# Patient Record
Sex: Female | Born: 1995 | Race: Black or African American | Hispanic: No | Marital: Single | Smoking: Never smoker
Health system: Southern US, Community
[De-identification: ages and names within clinical notes are randomized; demographics above are authoritative.]

## PROBLEM LIST (undated history)

## (undated) DIAGNOSIS — F419 Anxiety disorder, unspecified: Secondary | ICD-10-CM

## (undated) DIAGNOSIS — D649 Anemia, unspecified: Secondary | ICD-10-CM

## (undated) DIAGNOSIS — M303 Mucocutaneous lymph node syndrome [Kawasaki]: Secondary | ICD-10-CM

## (undated) DIAGNOSIS — R109 Unspecified abdominal pain: Secondary | ICD-10-CM

## (undated) DIAGNOSIS — U071 COVID-19: Secondary | ICD-10-CM

## (undated) DIAGNOSIS — N809 Endometriosis, unspecified: Secondary | ICD-10-CM

## (undated) DIAGNOSIS — F909 Attention-deficit hyperactivity disorder, unspecified type: Secondary | ICD-10-CM

## (undated) HISTORY — PX: EYE SURGERY: SHX253

## (undated) HISTORY — PX: MOUTH SURGERY: SHX715

## (undated) HISTORY — PX: WISDOM TOOTH EXTRACTION: SHX21

---

## 2018-08-12 ENCOUNTER — Encounter (HOSPITAL_COMMUNITY): Payer: Self-pay | Admitting: Obstetrics and Gynecology

## 2018-08-12 ENCOUNTER — Emergency Department (HOSPITAL_COMMUNITY)
Admission: EM | Admit: 2018-08-12 | Discharge: 2018-08-13 | Disposition: A | Discharge status: Home / Self Care | Payer: BC Managed Care – PPO | Attending: Emergency Medicine | Admitting: Emergency Medicine

## 2018-08-12 ENCOUNTER — Other Ambulatory Visit: Payer: Self-pay

## 2018-08-12 DIAGNOSIS — N3000 Acute cystitis without hematuria: Secondary | ICD-10-CM

## 2018-08-12 DIAGNOSIS — R1030 Lower abdominal pain, unspecified: Secondary | ICD-10-CM | POA: Diagnosis present

## 2018-08-12 LAB — COMPREHENSIVE METABOLIC PANEL
ALT: 27 U/L (ref 0–44)
AST: 34 U/L (ref 15–41)
Albumin: 3.6 g/dL (ref 3.5–5.0)
Alkaline Phosphatase: 74 U/L (ref 38–126)
Anion gap: 11 (ref 5–15)
BUN: 13 mg/dL (ref 6–20)
CO2: 23 mmol/L (ref 22–32)
Calcium: 8.6 mg/dL — ABNORMAL LOW (ref 8.9–10.3)
Chloride: 100 mmol/L (ref 98–111)
Creatinine, Ser: 0.66 mg/dL (ref 0.44–1.00)
GFR calc Af Amer: >60 mL/min (ref >60)
GFR calc non Af Amer: >60 mL/min (ref >60)
Glucose, Bld: 122 mg/dL — ABNORMAL HIGH (ref 70–99)
Potassium: 3.8 mmol/L (ref 3.5–5.1)
Sodium: 134 mmol/L — ABNORMAL LOW (ref 135–145)
Total Bilirubin: 0.5 mg/dL (ref 0.3–1.2)
Total Protein: 7 g/dL (ref 6.5–8.1)

## 2018-08-12 LAB — LIPASE, BLOOD: Lipase: 20 U/L (ref 11–51)

## 2018-08-12 LAB — CBC
HCT: 33.5 % — ABNORMAL LOW (ref 36.0–46.0)
Hemoglobin: 10.2 g/dL — ABNORMAL LOW (ref 12.0–15.0)
MCH: 23.5 pg — ABNORMAL LOW (ref 26.0–34.0)
MCHC: 30.4 g/dL (ref 30.0–36.0)
MCV: 77.2 fL — ABNORMAL LOW (ref 80.0–100.0)
Platelets: 256 10*3/uL (ref 150–400)
RBC: 4.34 MIL/uL (ref 3.87–5.11)
RDW: 14.1 % (ref 11.5–15.5)
WBC: 14.2 10*3/uL — ABNORMAL HIGH (ref 4.0–10.5)
nRBC: 0 % (ref 0.0–0.2)

## 2018-08-12 LAB — I-STAT BETA HCG BLOOD, ED (MC, WL, AP ONLY): I-stat hCG, quantitative: <5 m[IU]/mL (ref <5)

## 2018-08-12 MED ORDER — SODIUM CHLORIDE 0.9% FLUSH
3.0000 mL | Freq: Once | INTRAVENOUS | Status: AC
  Administered 2018-08-12: 3 mL via INTRAVENOUS

## 2018-08-12 MED ORDER — ACETAMINOPHEN 325 MG PO TABS
650.0000 mg | ORAL_TABLET | Freq: Once | ORAL | Status: AC
  Administered 2018-08-12: 650 mg via ORAL
  Filled 2018-08-12: qty 2

## 2018-08-12 MED ORDER — SODIUM CHLORIDE 0.9 % IV BOLUS
1000.0000 mL | Freq: Once | INTRAVENOUS | Status: AC
  Administered 2018-08-12: 1000 mL via INTRAVENOUS

## 2018-08-12 NOTE — ED Triage Notes (Signed)
Pt reports abdominal pain. Pt went to the urgent care this morning and was told she possibly had something wrong with her appendix. Pt reports pain with walking. Pt reports 1 episode of emesis x2 days ago. Pt reports she last ate at approximately 7pm

## 2018-08-12 NOTE — ED Provider Notes (Signed)
[facility]-EMERGENCY DEPT Provider Note   CSN: [id number] Arrival date & time: 08/12/18  2223    History   Chief Complaint Chief Complaint  Patient presents with  . Abdominal Pain    HPI Brooke Poole is a 23 y.o. female.      Abdominal Pain  Associated symptoms: nausea and vomiting      22 y.o. F here with lower abdominal pain.  Patient reports she started having lower abdominal pain on Sunday, 4 days ago.  Patient reports Tuesday, 2 days ago, pain got a little worse and caused her to vomit but none since then.  She states since that time she has not really had much of an appetite.  She does report urinary frequency and some "pressure" with urination but denies any dysuria or hematuria.  She is not had any pelvic pain or vaginal discharge.  She was seen at urgent care today and requested urinalysis, however was sent to the ER instead.  She denies history of UTIs.  No prior abdominal surgeries.  She has fever here, states she has been chilled at home but did not check temp as she did not have a thermometer.  No past medical history on file.  There are no active problems to display for this patient.    OB History    Gravida      Para      Term      Preterm      AB      Living  0     SAB      TAB      Ectopic      Multiple      Live Births               Home Medications    Prior to Admission medications   Not on File    Family History No family history on file.  Social History Social History   Tobacco Use  . Smoking status: Never Smoker  . Smokeless tobacco: Never Used  Substance Use Topics  . Alcohol use: Yes    Comment: Socially  . Drug use: Not Currently     Allergies   Patient has no allergy information on record.   Review of Systems Review of Systems  Gastrointestinal: Positive for abdominal pain, nausea and vomiting.  All other systems reviewed and are negative.    Physical Exam Updated Vital  Signs BP 123/77 (BP Location: Left Arm)   Pulse (!) 114   Temp (!) 101 F (38.3 C) (Oral)   Resp 20   Ht  (1.676 m)   Wt 49.9 kg   LMP 07/28/2018 (Approximate)   SpO2 100%   BMI 17.75 kg/m   Physical Exam Vitals signs and nursing note reviewed.  Constitutional:      Appearance: She is well-developed.  HENT:     Head: Normocephalic and atraumatic.  Eyes:     Conjunctiva/sclera: Conjunctivae normal.     Pupils: Pupils are equal, round, and reactive to light.  Neck:     Musculoskeletal: Normal range of motion.  Cardiovascular:     Rate and Rhythm: Normal rate and regular rhythm.     Heart sounds: Normal heart sounds.  Pulmonary:     Effort: Pulmonary effort is normal.     Breath sounds: Normal breath sounds.  Abdominal:     General: Bowel sounds are normal.     Palpations: Abdomen is soft.  Tenderness: Negative signs include Murphy's sign and McBurney's sign.     Comments: Very mild tenderness in right < left lower quadrants; no peritoneal signs; no CVA tenderness  Musculoskeletal: Normal range of motion.  Skin:    General: Skin is warm and dry.  Neurological:     Mental Status: She is alert and oriented to person, place, and time.      ED Treatments / Results  Labs (all labs ordered are listed, but only abnormal results are displayed) Labs Reviewed  COMPREHENSIVE METABOLIC PANEL - Abnormal; Notable for the following components:      Result Value   Sodium 134 (*)    Glucose, Bld 122 (*)    Calcium 8.6 (*)    All other components within normal limits  CBC - Abnormal; Notable for the following components:   WBC 14.2 (*)    Hemoglobin 10.2 (*)    HCT 33.5 (*)    MCV 77.2 (*)    MCH 23.5 (*)    All other components within normal limits  URINALYSIS, ROUTINE W REFLEX MICROSCOPIC - Abnormal; Notable for the following components:   Color, Urine STRAW (*)    Hgb urine dipstick SMALL (*)    Leukocytes,Ua SMALL (*)    Bacteria, UA MANY (*)    All other  components within normal limits  LIPASE, BLOOD  I-STAT BETA HCG BLOOD, ED ([facility], [facility], [facility] ONLY)    EKG None  Radiology No results found.  Procedures Procedures (including critical care time)  Medications Ordered in ED Medications  sodium chloride flush (NS) 0.9 % injection 3 mL (3 mLs Intravenous Given 08/12/18 2312)  sodium chloride 0.9 % bolus 1,000 mL (0 mLs Intravenous Stopped 08/13/18 0150)  acetaminophen (TYLENOL) tablet 650 mg (650 mg Oral Given 08/12/18 2311)  cefTRIAXone (ROCEPHIN) 1 g in sodium chloride 0.9 % 100 mL IVPB (0 g Intravenous Stopped 08/13/18 0150)     Initial Impression / Assessment and Plan / ED Course  I have reviewed the triage vital signs and the nursing notes.  Pertinent labs & imaging results that were available during my care of the patient were reviewed by me and considered in my medical decision making (see chart for details).  23 year old female presenting to the ED with lower abdominal pain.  Has been ongoing for several days now.  Episode of emesis 2 days ago, reports subjective fever, chills, poor appetite since then.  She does report urinary frequency but denies any dysuria.  She is febrile here but overall nontoxic in appearance.  Her abdomen is very soft with mild tenderness across the lower abdomen, left is actually worse than right.  She does not have any focal tenderness at McBurney's point and no peritoneal signs.  Labs as above, leukocytosis noted.  Her UA appears infectious.  She is not having any current CVA tenderness.  Feel UTI is because of her symptoms, lower suspicion for acute appendicitis or other surgical process at this time.  Patient was given a gram of IV Rocephin here in the ED along with IV fluids, states she is feeling better.  Plan to discharge home with oral Keflex.  Encouraged good oral hydration, tylenol/motrin for fever.  Recommended close follow-up with PCP.  Return here for any new/acute changes.  Final Clinical Impressions(s) /  ED Diagnoses   Final diagnoses:  Acute cystitis without hematuria    ED Discharge Orders         Ordered    cephALEXin (KEFLEX) 500 MG  capsule  2 times daily     08/13/18 0117           Garlon Hatchet, PA-C 08/13/18 8115    Raeford Razor, MD 08/16/18 [phone]

## 2018-08-13 LAB — URINALYSIS, ROUTINE W REFLEX MICROSCOPIC
Bilirubin Urine: NEGATIVE
Glucose, UA: NEGATIVE mg/dL
Ketones, ur: NEGATIVE mg/dL
Nitrite: NEGATIVE
Protein, ur: NEGATIVE mg/dL
Specific Gravity, Urine: 1.008 (ref 1.005–1.030)
pH: 7 (ref 5.0–8.0)

## 2018-08-13 MED ORDER — CEPHALEXIN 500 MG PO CAPS: 500.0000 mg | ORAL_CAPSULE | Freq: Two times a day (BID) | ORAL | 0 refills | Status: DC

## 2018-08-13 MED ORDER — SODIUM CHLORIDE 0.9 % IV SOLN
1.0000 g | Freq: Once | INTRAVENOUS | Status: AC
  Administered 2018-08-13: 1 g via INTRAVENOUS
  Filled 2018-08-13: qty 10

## 2018-08-13 NOTE — Discharge Instructions (Signed)
Take the prescribed medication as directed.  Can continue tylenol or motrin for fever. Follow-up with your primary care doctor. Return to the ED for new or worsening symptoms.

## 2019-10-03 ENCOUNTER — Encounter (HOSPITAL_BASED_OUTPATIENT_CLINIC_OR_DEPARTMENT_OTHER): Payer: Self-pay | Admitting: Obstetrics and Gynecology

## 2019-10-03 NOTE — H&P (Signed)
Brooke Poole is an 24 y.o. female 17 who failed multiple medications and desires surgical exploration and possible destruction of endometriosis. She has failed 7 different OCPs, nexplanon, an IUD, and the patch. She continues to have painful, heavy periods with nausea that require prescription strength pain medications and anti-emetics.  Pertinent Gynecological History: Menses: flow is excessive with use of 10 pads or tampons on heaviest days and with severe dysmenorrhea Bleeding: dysfunctional uterine bleeding Contraception: Ortho-Evra patches weekly DES exposure: denies Blood transfusions: none Sexually transmitted diseases: no past history Previous GYN Procedures: n/a  Last mammogram: n/a Last pap: n/a OB History: G0   Menstrual History: No LMP recorded.    Past Medical History:  Diagnosis Date  . ADHD   . Anxiety   . Endometriosis    suspected    Past Surgical History:  Procedure Laterality Date  . EYE SURGERY    . WISDOM TOOTH EXTRACTION      No family history on file.  Social History:  reports that she has never smoked. She has never used smokeless tobacco. She reports current alcohol use. She reports previous drug use.  Allergies: Not on File  No medications prior to admission.  Xulane vyvannse Flexeril prn Naproxen prn Propranolol prn  Review of Systems  There were no vitals taken for this visit. Physical Exam  Gen: well appearing, NAD CV: Reg rate Pulm: NWOB Abd: soft, nondistended, nontender, no masses GYN: uterus 6 week size, no adnexa ttp/CMT Ext: No edema b/l   No results found for this or any previous visit (from the past 24 hour(s)).  No results found.  Assessment/Plan: 24 yo w/pelvic pain refractoryto many medications currently on xulane patch.  After careful discussing, weighing the risks and benefits, she elected to have a laparoscopy for confirmation of endometriosis and possible fulguration and/or resection of endometriosis. I  reiterated that this type surgery is usually not curative and may not help her pain. I reviewed the possibility of finding no disease or that even if disease is found, it may be in areas that I am unable to treat (on bladder, too close to ureter). Furthermore, she may experience scarring from the surgery or need multiple surgeries with multiple subspecalties. Risks discussed including infection, bleeding, damage to surrounding structures, need for additional procedures, postoperative DVT and above. All questions answered. Consent signed.   Brooke Poole 10/03/2019, 9:45 AM

## 2019-10-06 ENCOUNTER — Ambulatory Visit: Payer: BC Managed Care – PPO | Attending: Family

## 2019-10-06 DIAGNOSIS — Z23 Encounter for immunization: Secondary | ICD-10-CM

## 2019-10-06 NOTE — Progress Notes (Signed)
   Covid-19 Vaccination Clinic  Name:  Brooke Poole    MRN: [id number] DOB: 06-19-1995  10/06/2019  Brooke Poole was observed post Covid-19 immunization for 15 minutes without incident. She was provided with Vaccine Information Sheet and instruction to access the V-Safe system.   Brooke Poole was instructed to call 911 with any severe reactions post vaccine: Marland Kitchen Difficulty breathing  . Swelling of face and throat  . A fast heartbeat  . A bad rash all over body  . Dizziness and weakness   Immunizations Administered    Name Date Dose VIS Date Route   Pfizer COVID-19 Vaccine 10/06/2019  1:05 PM 0.3 mL 07/13/2018 Intramuscular   Manufacturer: [employer], [employer]   Lot: [id number]   NDC: [address]-7

## 2019-10-18 ENCOUNTER — Other Ambulatory Visit: Payer: Self-pay

## 2019-10-18 ENCOUNTER — Encounter (HOSPITAL_BASED_OUTPATIENT_CLINIC_OR_DEPARTMENT_OTHER): Payer: Self-pay | Admitting: Obstetrics and Gynecology

## 2019-10-18 ENCOUNTER — Other Ambulatory Visit (HOSPITAL_COMMUNITY)
Admission: RE | Admit: 2019-10-18 | Discharge: 2019-10-18 | Disposition: A | Discharge status: Home / Self Care | Payer: BC Managed Care – PPO | Source: Ambulatory Visit | Attending: Obstetrics and Gynecology | Admitting: Obstetrics and Gynecology

## 2019-10-18 DIAGNOSIS — Z20822 Contact with and (suspected) exposure to covid-19: Secondary | ICD-10-CM | POA: Insufficient documentation

## 2019-10-18 DIAGNOSIS — Z01812 Encounter for preprocedural laboratory examination: Secondary | ICD-10-CM | POA: Insufficient documentation

## 2019-10-18 NOTE — Progress Notes (Addendum)
Addendum: spoke with jessica zanetto pa patient meets surgery center guidelines  Spoke w/ via phone for pre-op interview---mother alecia and patient Lab needs dos----      Urine preg, type and screen         COVID test ------10-18-2019 at 240 pm Arrive at ------530 am 10-21-2019 NPO after ------midnight Medications to take morning of surgery -----may leave xulane birth control patch on Diabetic medication -----n/a Patient Special Instructions -----none Pre-Op special Istructions -----none Patient verbalized understanding of instructions that were given at this phone interview. Patient denies shortness of breath, chest pain, fever, cough a this phone interview.  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  Patient has xulane birth control patch on ask anesthesia if needs to remove patch

## 2019-10-19 LAB — SARS CORONAVIRUS 2 (TAT 6-24 HRS): SARS Coronavirus 2: NEGATIVE

## 2019-10-20 NOTE — Anesthesia Preprocedure Evaluation (Addendum)
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   Neuro/Psych negative neurological ROS  negative psych ROS   GI/Hepatic negative GI ROS, Neg liver ROS,   Endo/Other  negative endocrine ROS  Renal/GU negative Renal ROS   Chronic pelvic pain, endometriosis    Musculoskeletal negative musculoskeletal ROS (+)   Abdominal   Peds  Hematology negative hematology ROS (+)   Anesthesia Other Findings   Reproductive/Obstetrics                         Anesthesia Physical Anesthesia Plan  ASA: II  Anesthesia Plan: General   Post-op Pain Management:    Induction: Intravenous  PONV Risk Score and Plan: 3 and Ondansetron, Dexamethasone, Treatment may vary due to age or medical condition and Midazolam  Airway Management Planned: Oral ETT  Additional Equipment: None  Intra-op Plan:   Post-operative Plan: Extubation in OR  Informed Consent: I have reviewed the patients History and Physical, chart, labs and discussed the procedure including the risks, benefits and alternatives for the proposed anesthesia with the patient or authorized representative who has indicated his/her understanding and acceptance.     Dental advisory given  Plan Discussed with:   Anesthesia Plan Comments:        Anesthesia Quick Evaluation

## 2019-10-21 ENCOUNTER — Ambulatory Visit (HOSPITAL_BASED_OUTPATIENT_CLINIC_OR_DEPARTMENT_OTHER): Payer: BC Managed Care – PPO | Admitting: Physician Assistant

## 2019-10-21 ENCOUNTER — Encounter (HOSPITAL_BASED_OUTPATIENT_CLINIC_OR_DEPARTMENT_OTHER): Payer: Self-pay | Admitting: Obstetrics and Gynecology

## 2019-10-21 ENCOUNTER — Other Ambulatory Visit: Payer: Self-pay

## 2019-10-21 ENCOUNTER — Encounter (HOSPITAL_BASED_OUTPATIENT_CLINIC_OR_DEPARTMENT_OTHER)
Admission: RE | Disposition: A | Discharge status: Home / Self Care | Payer: Self-pay | Source: Home / Self Care | Attending: Obstetrics and Gynecology

## 2019-10-21 ENCOUNTER — Ambulatory Visit (HOSPITAL_BASED_OUTPATIENT_CLINIC_OR_DEPARTMENT_OTHER)
Admission: RE | Admit: 2019-10-21 | Discharge: 2019-10-21 | Disposition: A | Discharge status: Home / Self Care | Payer: BC Managed Care – PPO | Attending: Obstetrics and Gynecology | Admitting: Obstetrics and Gynecology

## 2019-10-21 DIAGNOSIS — Z793 Long term (current) use of hormonal contraceptives: Secondary | ICD-10-CM | POA: Diagnosis not present

## 2019-10-21 DIAGNOSIS — Z79899 Other long term (current) drug therapy: Secondary | ICD-10-CM | POA: Diagnosis not present

## 2019-10-21 DIAGNOSIS — N938 Other specified abnormal uterine and vaginal bleeding: Secondary | ICD-10-CM | POA: Insufficient documentation

## 2019-10-21 DIAGNOSIS — N946 Dysmenorrhea, unspecified: Secondary | ICD-10-CM | POA: Insufficient documentation

## 2019-10-21 DIAGNOSIS — F909 Attention-deficit hyperactivity disorder, unspecified type: Secondary | ICD-10-CM | POA: Diagnosis not present

## 2019-10-21 DIAGNOSIS — G8929 Other chronic pain: Secondary | ICD-10-CM | POA: Insufficient documentation

## 2019-10-21 DIAGNOSIS — R102 Pelvic and perineal pain: Secondary | ICD-10-CM | POA: Insufficient documentation

## 2019-10-21 HISTORY — DX: Attention-deficit hyperactivity disorder, unspecified type: F90.9

## 2019-10-21 HISTORY — DX: Endometriosis, unspecified: N80.9

## 2019-10-21 HISTORY — DX: Anemia, unspecified: D64.9

## 2019-10-21 HISTORY — DX: Mucocutaneous lymph node syndrome (kawasaki): M30.3

## 2019-10-21 HISTORY — DX: Anxiety disorder, unspecified: F41.9

## 2019-10-21 HISTORY — PX: LAPAROSCOPY: SHX197

## 2019-10-21 HISTORY — DX: COVID-19: U07.1

## 2019-10-21 HISTORY — DX: Unspecified abdominal pain: R10.9

## 2019-10-21 LAB — POCT PREGNANCY, URINE: Preg Test, Ur: NEGATIVE

## 2019-10-21 LAB — TYPE AND SCREEN
ABO/RH(D): O POS
Antibody Screen: NEGATIVE

## 2019-10-21 LAB — ABO/RH: ABO/RH(D): O POS

## 2019-10-21 SURGERY — LAPAROSCOPY, DIAGNOSTIC
Anesthesia: General | Site: Abdomen

## 2019-10-21 MED ORDER — OXYCODONE HCL 5 MG PO TABS
ORAL_TABLET | ORAL | Status: AC
  Filled 2019-10-21: qty 1

## 2019-10-21 MED ORDER — ACETAMINOPHEN 500 MG PO TABS
1000.0000 mg | ORAL_TABLET | ORAL | Status: AC
  Administered 2019-10-21: 1000 mg via ORAL

## 2019-10-21 MED ORDER — DEXAMETHASONE SODIUM PHOSPHATE 10 MG/ML IJ SOLN
INTRAMUSCULAR | Status: DC | PRN
  Administered 2019-10-21: 10 mg via INTRAVENOUS

## 2019-10-21 MED ORDER — OXYCODONE HCL 5 MG PO TABS: 5.0000 mg | ORAL_TABLET | Freq: Four times a day (QID) | ORAL | 0 refills | Status: DC | PRN

## 2019-10-21 MED ORDER — FENTANYL CITRATE (PF) 100 MCG/2ML IJ SOLN
25.0000 ug | INTRAMUSCULAR | Status: DC | PRN
  Administered 2019-10-21: 50 ug via INTRAVENOUS

## 2019-10-21 MED ORDER — PROPOFOL 10 MG/ML IV BOLUS
INTRAVENOUS | Status: DC | PRN
  Administered 2019-10-21: 200 mg via INTRAVENOUS

## 2019-10-21 MED ORDER — KETOROLAC TROMETHAMINE 30 MG/ML IJ SOLN
INTRAMUSCULAR | Status: AC
  Filled 2019-10-21: qty 1

## 2019-10-21 MED ORDER — ONDANSETRON HCL 4 MG/2ML IJ SOLN: 4.0000 mg | Freq: Once | INTRAMUSCULAR | Status: DC | PRN

## 2019-10-21 MED ORDER — FENTANYL CITRATE (PF) 100 MCG/2ML IJ SOLN
INTRAMUSCULAR | Status: DC | PRN
  Administered 2019-10-21 (×3): 50 ug via INTRAVENOUS

## 2019-10-21 MED ORDER — MIDAZOLAM HCL 5 MG/5ML IJ SOLN
INTRAMUSCULAR | Status: DC | PRN
  Administered 2019-10-21: 2 mg via INTRAVENOUS

## 2019-10-21 MED ORDER — FENTANYL CITRATE (PF) 100 MCG/2ML IJ SOLN
INTRAMUSCULAR | Status: AC
  Filled 2019-10-21: qty 2

## 2019-10-21 MED ORDER — ACETAMINOPHEN 500 MG PO TABS
ORAL_TABLET | ORAL | Status: AC
  Filled 2019-10-21: qty 2

## 2019-10-21 MED ORDER — PROPOFOL 10 MG/ML IV BOLUS
INTRAVENOUS | Status: AC
  Filled 2019-10-21: qty 20

## 2019-10-21 MED ORDER — LACTATED RINGERS IV SOLN
INTRAVENOUS | Status: DC
  Administered 2019-10-21: 1000 mL via INTRAVENOUS

## 2019-10-21 MED ORDER — MIDAZOLAM HCL 2 MG/2ML IJ SOLN
INTRAMUSCULAR | Status: AC
  Filled 2019-10-21: qty 2

## 2019-10-21 MED ORDER — DEXAMETHASONE SODIUM PHOSPHATE 10 MG/ML IJ SOLN
INTRAMUSCULAR | Status: AC
  Filled 2019-10-21: qty 1

## 2019-10-21 MED ORDER — LIDOCAINE 2% (20 MG/ML) 5 ML SYRINGE
INTRAMUSCULAR | Status: DC | PRN
  Administered 2019-10-21: 80 mg via INTRAVENOUS

## 2019-10-21 MED ORDER — DOCUSATE SODIUM 100 MG PO CAPS
100.0000 mg | ORAL_CAPSULE | Freq: Two times a day (BID) | ORAL | 2 refills | Status: DC
Start: 2019-10-21 — End: 2021-05-21

## 2019-10-21 MED ORDER — OXYCODONE HCL 5 MG/5ML PO SOLN: 5.0000 mg | Freq: Once | ORAL | Status: AC | PRN

## 2019-10-21 MED ORDER — ROCURONIUM BROMIDE 10 MG/ML (PF) SYRINGE
PREFILLED_SYRINGE | INTRAVENOUS | Status: AC
  Filled 2019-10-21: qty 10

## 2019-10-21 MED ORDER — ONDANSETRON HCL 4 MG/2ML IJ SOLN
INTRAMUSCULAR | Status: DC | PRN
  Administered 2019-10-21: 4 mg via INTRAVENOUS

## 2019-10-21 MED ORDER — OXYCODONE HCL 5 MG PO TABS
5.0000 mg | ORAL_TABLET | Freq: Once | ORAL | Status: AC | PRN
  Administered 2019-10-21: 5 mg via ORAL

## 2019-10-21 MED ORDER — LIDOCAINE 2% (20 MG/ML) 5 ML SYRINGE
INTRAMUSCULAR | Status: AC
  Filled 2019-10-21: qty 5

## 2019-10-21 MED ORDER — ROCURONIUM BROMIDE 10 MG/ML (PF) SYRINGE
PREFILLED_SYRINGE | INTRAVENOUS | Status: DC | PRN
  Administered 2019-10-21: 40 mg via INTRAVENOUS

## 2019-10-21 MED ORDER — KETOROLAC TROMETHAMINE 30 MG/ML IJ SOLN
INTRAMUSCULAR | Status: DC | PRN
  Administered 2019-10-21: 30 mg via INTRAVENOUS

## 2019-10-21 MED ORDER — SUGAMMADEX SODIUM 200 MG/2ML IV SOLN
INTRAVENOUS | Status: DC | PRN
  Administered 2019-10-21: 150 mg via INTRAVENOUS

## 2019-10-21 MED ORDER — IBUPROFEN 800 MG PO TABS: 800.0000 mg | ORAL_TABLET | Freq: Three times a day (TID) | ORAL | 0 refills | Status: AC | PRN

## 2019-10-21 MED ORDER — ONDANSETRON HCL 4 MG/2ML IJ SOLN
INTRAMUSCULAR | Status: AC
  Filled 2019-10-21: qty 2

## 2019-10-21 SURGICAL SUPPLY — 34 items
CABLE HIGH FREQUENCY MONO STRZ (ELECTRODE) IMPLANT
CATH ROBINSON RED A/P 16FR (CATHETERS) ×3 IMPLANT
COVER MAYO STAND STRL (DRAPES) ×3 IMPLANT
COVER WAND RF STERILE (DRAPES) ×3 IMPLANT
DERMABOND ADVANCED (GAUZE/BANDAGES/DRESSINGS) ×1
DERMABOND ADVANCED .7 DNX12 (GAUZE/BANDAGES/DRESSINGS) ×2 IMPLANT
DRSG OPSITE POSTOP 3X4 (GAUZE/BANDAGES/DRESSINGS) IMPLANT
DURAPREP 26ML APPLICATOR (WOUND CARE) ×3 IMPLANT
GAUZE 4X4 16PLY RFD (DISPOSABLE) ×3 IMPLANT
GLOVE BIO SURGEON STRL SZ 6.5 (GLOVE) ×9 IMPLANT
GLOVE BIOGEL PI IND STRL 6.5 (GLOVE) ×6 IMPLANT
GLOVE BIOGEL PI INDICATOR 6.5 (GLOVE) ×3
GOWN STRL REUS W/TWL LRG LVL3 (GOWN DISPOSABLE) ×9 IMPLANT
IV NS 1000ML (IV SOLUTION) ×1
IV NS 1000ML BAXH (IV SOLUTION) ×2 IMPLANT
LIGASURE LAP L-HOOKWIRE 5 44CM (INSTRUMENTS) IMPLANT
NEEDLE HYPO 25X1 1.5 SAFETY (NEEDLE) ×3 IMPLANT
NEEDLE INSUFFLATION 120MM (ENDOMECHANICALS) ×3 IMPLANT
NS IRRIG 500ML POUR BTL (IV SOLUTION) ×3 IMPLANT
PACK LAPAROSCOPY BASIN (CUSTOM PROCEDURE TRAY) ×3 IMPLANT
SCISSORS LAP 5X35 DISP (ENDOMECHANICALS) IMPLANT
SCISSORS LAP 5X45 EPIX DISP (ENDOMECHANICALS) IMPLANT
SET SUCTION IRRIG HYDROSURG (IRRIGATION / IRRIGATOR) ×3 IMPLANT
SET TUBE SMOKE EVAC HIGH FLOW (TUBING) ×3 IMPLANT
SUT VICRYL 0 UR6 27IN ABS (SUTURE) ×3 IMPLANT
SUT VICRYL RAPIDE 4/0 PS 2 (SUTURE) ×6 IMPLANT
SYR 50ML LL SCALE MARK (SYRINGE) IMPLANT
SYS BAG RETRIEVAL 10MM (BASKET)
SYSTEM BAG RETRIEVAL 10MM (BASKET) IMPLANT
TOWEL OR 17X26 10 PK STRL BLUE (TOWEL DISPOSABLE) ×3 IMPLANT
TROCAR BLADELESS OPT 5 100 (ENDOMECHANICALS) ×6 IMPLANT
TROCAR XCEL NON-BLD 11X100MML (ENDOMECHANICALS) ×3 IMPLANT
WARMER LAPAROSCOPE (MISCELLANEOUS) ×3 IMPLANT
WATER STERILE IRR 500ML POUR (IV SOLUTION) ×3 IMPLANT

## 2019-10-21 NOTE — Transfer of Care (Signed)
Immediate Anesthesia Transfer of Care Note  Patient: Brooke Poole  Procedure(s) Performed: DIAGNOSTIC LAPAROSCOPY (N/A Abdomen)  Patient Location: PACU  Anesthesia Type:General  Level of Consciousness: awake, alert  and oriented  Airway & Oxygen Therapy: Patient Spontanous Breathing and Patient connected to nasal cannula oxygen  Post-op Assessment: Report given to RN and Post -op Vital signs reviewed and stable  Post vital signs: Reviewed and stable  Last Vitals:  Vitals Value Taken Time  BP 146/88 10/21/19 0830  Temp 36.3 C 10/21/19 0826  Pulse 53 10/21/19 0831  Resp 8 10/21/19 0831  SpO2 100 % 10/21/19 0831  Vitals shown include unvalidated device data.  Last Pain:  Vitals:   10/21/19 0625  TempSrc: Oral      Patients Stated Pain Goal: 7 (10/21/19 4825)  Complications: No apparent anesthesia complications

## 2019-10-21 NOTE — Discharge Instructions (Signed)
DISCHARGE INSTRUCTIONS: Laparoscopy  The following instructions have been prepared to help you care for yourself upon your return home today.  Wound care:  Do not get the incision wet for the first 24 hours. The incision should be kept clean and dry.  The Band-Aids or dressings may be removed the day after surgery.  Should the incision become sore, red, and swollen after the first week, check with your doctor.  Personal hygiene:  Shower the day after your procedure.  Activity and limitations:  Do NOT drive or operate any equipment today.  Do NOT lift anything more than 15 pounds for 2-3 weeks after surgery.  Do NOT rest in bed all day.  Walking is encouraged. Walk each day, starting slowly with 5-minute walks 3 or 4 times a day. Slowly increase the length of your walks.  Walk up and down stairs slowly.  Do NOT do strenuous activities, such as golfing, playing tennis, bowling, running, biking, weight lifting, gardening, mowing, or vacuuming for 2-4 weeks. Ask your doctor when it is okay to start.  Diet: Eat a light meal as desired this evening. You may resume your usual diet tomorrow.  Return to work: This is dependent on the type of work you do. For the most part you can return to a desk job within a week of surgery. If you are more active at work, please discuss this with your doctor.  What to expect after your surgery: You may have a slight burning sensation when you urinate on the first day. You may have a very small amount of blood in the urine. Expect to have a small amount of vaginal discharge/light bleeding for 1-2 weeks. It is not unusual to have abdominal soreness and bruising for up to 2 weeks. You may be tired and need more rest for about 1 week. You may experience shoulder pain for 24-72 hours. Lying flat in bed may relieve it.  Call your doctor for any of the following:  Develop a fever of 100.4 or greater  Inability to urinate 6 hours after discharge from hospital  Severe  pain not relieved by pain medications  Persistent of heavy bleeding at incision site  Redness or swelling around incision site after a week  Increasing nausea or vomiting     Post Anesthesia Home Care Instructions  Activity: Get plenty of rest for the remainder of the day. A responsible individual must stay with you for 24 hours following the procedure.  For the next 24 hours, DO NOT: -Drive a car -Operate machinery -Drink alcoholic beverages -Take any medication unless instructed by your physician -Make any legal decisions or sign important papers.  Meals: Start with liquid foods such as gelatin or soup. Progress to regular foods as tolerated. Avoid greasy, spicy, heavy foods. If nausea and/or vomiting occur, drink only clear liquids until the nausea and/or vomiting subsides. Call your physician if vomiting continues.  Special Instructions/Symptoms: Your throat may feel dry or sore from the anesthesia or the breathing tube placed in your throat during surgery. If this causes discomfort, gargle with warm salt water. The discomfort should disappear within 24 hours.       

## 2019-10-21 NOTE — Anesthesia Procedure Notes (Signed)
Procedure Name: Intubation Date/Time: 10/21/2019 7:32 AM Performed by: Cici Rodriges D, CRNA Pre-anesthesia Checklist: Patient identified, Emergency Drugs available, Suction available and Patient being monitored Patient Re-evaluated:Patient Re-evaluated prior to induction Oxygen Delivery Method: Circle system utilized Preoxygenation: Pre-oxygenation with 100% oxygen Induction Type: IV induction Ventilation: Mask ventilation without difficulty Laryngoscope Size: Mac and 3 Grade View: Grade I Tube type: Oral Tube size: 7.0 mm Number of attempts: 1 Airway Equipment and Method: Stylet Placement Confirmation: ETT inserted through vocal cords under direct vision,  positive ETCO2 and breath sounds checked- equal and bilateral Secured at: 20 cm Tube secured with: Tape Dental Injury: Teeth and Oropharynx as per pre-operative assessment

## 2019-10-21 NOTE — Anesthesia Postprocedure Evaluation (Signed)
Anesthesia Post Note  Patient: Brooke Poole  Procedure(s) Performed: DIAGNOSTIC LAPAROSCOPY (N/A Abdomen)     Patient location during evaluation: PACU Anesthesia Type: General Level of consciousness: awake and alert Pain management: pain level controlled Vital Signs Assessment: post-procedure vital signs reviewed and stable Respiratory status: spontaneous breathing, nonlabored ventilation and respiratory function stable Cardiovascular status: blood pressure returned to baseline and stable Postop Assessment: no apparent nausea or vomiting Anesthetic complications: no    Last Vitals:  Vitals:   10/21/19 0830 10/21/19 0845  BP: (!) 146/88 122/80  Pulse: 87 (!) 57  Resp: 14 17  Temp:    SpO2: 100% 99%    Last Pain:  Vitals:   10/21/19 0826  TempSrc:   PainSc: 8                  Lucretia Kern

## 2019-10-21 NOTE — Progress Notes (Signed)
No updates to above H&P. Patient arrived NPO and was consented in PACU. Risks again discussed, all questions answered, and consent signed. Proceed with above surgery.    Gaia Gullikson MD  

## 2019-10-21 NOTE — Op Note (Signed)
DATE OF PROCEDURE: 10/21/19  PREOPERATIVE DIAGNOSIS: endometriosis  POSTOPERATIVE DIAGNOSIS: Normal pelvis cannot exclude endometriosis  PROCEDURE PERFORMED: Diagnostic laparoscopy   SURGEON: Belva Agee, MD  ANESTHESIA: General  ESTIMATED BLOOD LOSS: 10cc.  URINE OUTPUT: See anesthesia record  COMPLICATIONS: None  TUBES: None.  DRAINS: None  PATHOLOGY: None  FINDINGS: On exam, under anesthesia, normal appearing vulva and vagina, a normal sized uterus  Operative findings demonstrated a normal uterus. Normal  fallopian tubes and ovaries bilaterally. The appendix was normal appearing. The bowel and omentum were normal appearing.  Procedure: A general anesthesia was induced and the patient was placed in the dirsal lithotomy position. The abdomen, perineum, and vagina were prepped and draped in the usual fashion. The bladder was drained. After the initial preparation, the procedure commenced at the vagina.  With a speculum in place to visualize the cervix, the cervix was grasped and a jacobs tenaculum was placed within the uterine cavity for manipulation purposes being careful not to puncture the uterus. Attention was then turned to the abdomen.    An infraumbilical incision was made and the Veress needle was gently advanced taking care to feel for the typical sensation of penetrating the peritoneum. With CO2 infiltration, an opening pressure of was noted, and following this, a pneumoperitoneum of 15 mmHg was created. A 5 mm trocar was then passed through the same incision and the laparoscope was then inserted through the trocar sleeve.   Visualization of the peritoneal cavity was then obtained and a brief inspection did not reveal any signs of complications from entry. Under direct observation, a 66mm suprapubic port was placed taking care to respect anatomical landmarks and vessels. Once the placement of the port was complete, the actual laparoscopic procedure began. Above operative  findings noted upon detailed abdominal inspection.   All gas removed from abdomen and all instruments were removed from all places in the body. Skin closed with 4'0 vicryl. The sponge and lap counts were correct times 2 at this time.   The patient's procedure was terminated. We then awakened her. She was sent to the Recovery Room in good condition.    Rosie Fate MD

## 2019-11-01 ENCOUNTER — Ambulatory Visit: Payer: BC Managed Care – PPO | Attending: Family

## 2019-11-01 DIAGNOSIS — Z23 Encounter for immunization: Secondary | ICD-10-CM

## 2019-11-01 NOTE — Progress Notes (Signed)
   Covid-19 Vaccination Clinic  Name:  HADAR ELGERSMA    MRN: [id number] DOB: 06/17/1995  11/01/2019  Ms. Alcalde was observed post Covid-19 immunization for 15 minutes without incident. She was provided with Vaccine Information Sheet and instruction to access the V-Safe system.   Ms. Parrillo was instructed to call 911 with any severe reactions post vaccine: Marland Kitchen Difficulty breathing  . Swelling of face and throat  . A fast heartbeat  . A bad rash all over body  . Dizziness and weakness   Immunizations Administered    Name Date Dose VIS Date Route   Pfizer COVID-19 Vaccine 11/01/2019  1:03 PM 0.3 mL 07/13/2018 Intramuscular   Manufacturer: [employer], [employer]   Lot: [id number]   NDC: [address]-4

## 2020-06-28 ENCOUNTER — Other Ambulatory Visit: Payer: Self-pay | Admitting: Gastroenterology

## 2020-06-28 DIAGNOSIS — R1013 Epigastric pain: Secondary | ICD-10-CM

## 2020-07-24 ENCOUNTER — Other Ambulatory Visit: Payer: BC Managed Care – PPO

## 2020-08-17 ENCOUNTER — Other Ambulatory Visit: Payer: Self-pay

## 2020-08-17 ENCOUNTER — Other Ambulatory Visit: Payer: BC Managed Care – PPO

## 2020-08-17 ENCOUNTER — Ambulatory Visit
Admission: RE | Admit: 2020-08-17 | Discharge: 2020-08-17 | Disposition: A | Discharge status: Home / Self Care | Payer: BC Managed Care – PPO | Source: Ambulatory Visit | Attending: Gastroenterology | Admitting: Gastroenterology

## 2020-08-17 DIAGNOSIS — R1013 Epigastric pain: Secondary | ICD-10-CM

## 2020-08-17 MED ORDER — IOPAMIDOL (ISOVUE-300) INJECTION 61%
100.0000 mL | Freq: Once | INTRAVENOUS | Status: AC | PRN
  Administered 2020-08-17: 100 mL via INTRAVENOUS

## 2020-10-13 ENCOUNTER — Encounter (HOSPITAL_BASED_OUTPATIENT_CLINIC_OR_DEPARTMENT_OTHER): Payer: Self-pay

## 2020-10-13 ENCOUNTER — Emergency Department (HOSPITAL_BASED_OUTPATIENT_CLINIC_OR_DEPARTMENT_OTHER)
Admission: EM | Admit: 2020-10-13 | Discharge: 2020-10-13 | Disposition: A | Discharge status: Home / Self Care | Payer: BC Managed Care – PPO | Attending: Emergency Medicine | Admitting: Emergency Medicine

## 2020-10-13 ENCOUNTER — Other Ambulatory Visit: Payer: Self-pay

## 2020-10-13 ENCOUNTER — Emergency Department (HOSPITAL_BASED_OUTPATIENT_CLINIC_OR_DEPARTMENT_OTHER): Payer: BC Managed Care – PPO

## 2020-10-13 DIAGNOSIS — Z8616 Personal history of COVID-19: Secondary | ICD-10-CM | POA: Insufficient documentation

## 2020-10-13 DIAGNOSIS — R079 Chest pain, unspecified: Secondary | ICD-10-CM

## 2020-10-13 LAB — CBC WITH DIFFERENTIAL/PLATELET
Abs Immature Granulocytes: 0.01 10*3/uL (ref 0.00–0.07)
Basophils Absolute: 0.1 10*3/uL (ref 0.0–0.1)
Basophils Relative: 2 %
Eosinophils Absolute: 0.1 10*3/uL (ref 0.0–0.5)
Eosinophils Relative: 1 %
HCT: 34.7 % — ABNORMAL LOW (ref 36.0–46.0)
Hemoglobin: 10.9 g/dL — ABNORMAL LOW (ref 12.0–15.0)
Immature Granulocytes: 0 %
Lymphocytes Relative: 50 %
Lymphs Abs: 2.9 10*3/uL (ref 0.7–4.0)
MCH: 22.8 pg — ABNORMAL LOW (ref 26.0–34.0)
MCHC: 31.4 g/dL (ref 30.0–36.0)
MCV: 72.4 fL — ABNORMAL LOW (ref 80.0–100.0)
Monocytes Absolute: 0.4 10*3/uL (ref 0.1–1.0)
Monocytes Relative: 8 %
Neutro Abs: 2.2 10*3/uL (ref 1.7–7.7)
Neutrophils Relative %: 39 %
Platelets: 295 10*3/uL (ref 150–400)
RBC: 4.79 MIL/uL (ref 3.87–5.11)
RDW: 13.8 % (ref 11.5–15.5)
WBC: 5.7 10*3/uL (ref 4.0–10.5)
nRBC: 0 % (ref 0.0–0.2)

## 2020-10-13 LAB — TROPONIN I (HIGH SENSITIVITY): Troponin I (High Sensitivity): <2 ng/L (ref <18)

## 2020-10-13 LAB — BASIC METABOLIC PANEL
Anion gap: 11 (ref 5–15)
BUN: 11 mg/dL (ref 6–20)
CO2: 24 mmol/L (ref 22–32)
Calcium: 9.5 mg/dL (ref 8.9–10.3)
Chloride: 102 mmol/L (ref 98–111)
Creatinine, Ser: 0.61 mg/dL (ref 0.44–1.00)
GFR, Estimated: >60 mL/min (ref >60)
Glucose, Bld: 102 mg/dL — ABNORMAL HIGH (ref 70–99)
Potassium: 3.1 mmol/L — ABNORMAL LOW (ref 3.5–5.1)
Sodium: 137 mmol/L (ref 135–145)

## 2020-10-13 NOTE — ED Provider Notes (Signed)
[facility] EMERGENCY DEPT Provider Note   CSN: [id number] Arrival date & time: 10/13/20  2008     History Chief Complaint  Patient presents with  . Chest Pain    DARA BEIDLEMAN is a 25 y.o. female.  The history is provided by the patient.  Chest Pain Pain location:  L chest Pain quality: aching   Pain radiates to:  Neck Pain severity:  Mild Onset quality:  Gradual Timing:  Intermittent Progression:  Waxing and waning Chronicity:  New Relieved by:  Nothing Worsened by:  Nothing Associated symptoms: no abdominal pain, no back pain, no cough, no fever, no palpitations, no shortness of breath and no vomiting   Risk factors: no coronary artery disease, no high cholesterol, no hypertension and no prior DVT/PE        Past Medical History:  Diagnosis Date  . ADHD   . Anemia   . Anxiety   . COVID-19 summer 2020   loss of taste and smell runny, all symptoms resolved except sense of smell  . Endometriosis    suspected  . Kawasaki disease (HCC) from age 89 to age 36   last work up 5 yrs ago in [place] all kawasaki disease resolved  . Stomach pain    when eating    There are no problems to display for this patient.   Past Surgical History:  Procedure Laterality Date  . EYE SURGERY Left as child  . LAPAROSCOPY N/A 10/21/2019   Procedure: DIAGNOSTIC LAPAROSCOPY;  Surgeon: [name], MD;  Location: [facility];  Service: Gynecology;  Laterality: N/A;  . MOUTH SURGERY     pull tooth   . MOUTH SURGERY     bracket placed   . WISDOM TOOTH EXTRACTION       OB History    Gravida      Para      Term      Preterm      AB      Living  0     SAB      IAB      Ectopic      Multiple      Live Births              No family history on file.  Social History   Tobacco Use  . Smoking status: Never Smoker  . Smokeless tobacco: Never Used  Vaping Use  . Vaping Use: Every day  . Substances: Nicotine, Flavoring   Substance Use Topics  . Alcohol use: Yes    Comment: Socially  . Drug use: Not Currently    Home Medications Prior to Admission medications   Medication Sig Start Date End Date Taking? Authorizing Provider  docusate sodium (COLACE) 100 MG capsule Take 1 capsule (100 mg total) by mouth 2 (two) times daily. 10/21/19   [name], MD  ibuprofen (ADVIL) 800 MG tablet Take 1 tablet (800 mg total) by mouth every 8 (eight) hours as needed. 10/21/19   [name], MD  lisdexamfetamine (VYVANSE) 40 MG capsule Take 40 mg by mouth daily as needed.    [provider]  norelgestromin-ethinyl estradiol [employer]) 150-35 MCG/24HR transdermal patch Place 1 patch onto the skin once a week. Changes on sunday    [provider]  oxyCODONE (OXY IR/ROXICODONE) 5 MG immediate release tablet Take 1 tablet (5 mg total) by mouth every 6 (six) hours as needed for severe pain. 10/21/19   [name],  MD  UNABLE TO FIND Iron prn    [provider]  UNABLE TO FIND cbd oil prn    [provider]  UNABLE TO FIND Digestive enzyme qod    [provider]    Allergies    Patient has no known allergies.  Review of Systems   Review of Systems  Constitutional: Negative for chills and fever.  HENT: Negative for ear pain and sore throat.   Eyes: Negative for pain and visual disturbance.  Respiratory: Negative for cough and shortness of breath.   Cardiovascular: Positive for chest pain. Negative for palpitations.  Gastrointestinal: Negative for abdominal pain and vomiting.  Genitourinary: Negative for dysuria and hematuria.  Musculoskeletal: Negative for arthralgias and back pain.  Skin: Negative for color change and rash.  Neurological: Negative for seizures and syncope.  Psychiatric/Behavioral: The patient is nervous/anxious.   All other systems reviewed and are negative.   Physical Exam Updated Vital Signs BP 131/90   Pulse (!) 56   Temp 98.4  F (36.9 C) (Oral)   Resp 16   Ht 5\' 6"  (1.676 m)   Wt 57.6 kg   LMP 10/01/2020   SpO2 100%   BMI 20.50 kg/m   Physical Exam Vitals and nursing note reviewed.  Constitutional:      General: She is not in acute distress.    Appearance: She is well-developed. She is not ill-appearing.  HENT:     Head: Normocephalic and atraumatic.  Eyes:     Conjunctiva/sclera: Conjunctivae normal.  Cardiovascular:     Rate and Rhythm: Normal rate and regular rhythm.     Pulses:          Radial pulses are 2+ on the right side and 2+ on the left side.     Heart sounds: No murmur heard.   Pulmonary:     Effort: Pulmonary effort is normal. No respiratory distress.     Breath sounds: Normal breath sounds. No decreased breath sounds, wheezing or rhonchi.  Abdominal:     Palpations: Abdomen is soft.     Tenderness: There is no abdominal tenderness.  Musculoskeletal:        General: Normal range of motion.     Cervical back: Neck supple.     Right lower leg: No edema.     Left lower leg: No edema.  Skin:    General: Skin is warm and dry.     Capillary Refill: Capillary refill takes less than 2 seconds.  Neurological:     General: No focal deficit present.     Mental Status: She is alert.  Psychiatric:        Mood and Affect: Mood normal.     ED Results / Procedures / Treatments   Labs (all labs ordered are listed, but only abnormal results are displayed) Labs Reviewed  CBC WITH DIFFERENTIAL/PLATELET - Abnormal; Notable for the following components:      Result Value   Hemoglobin 10.9 (*)    HCT 34.7 (*)    MCV 72.4 (*)    MCH 22.8 (*)    All other components within normal limits  BASIC METABOLIC PANEL - Abnormal; Notable for the following components:   Potassium 3.1 (*)    Glucose, Bld 102 (*)    All other components within normal limits  TROPONIN I (HIGH SENSITIVITY)    EKG EKG Interpretation  Date/Time:  Saturday Oct 13 2020 20:21:52 EDT Ventricular Rate:  60 PR  Interval:  158 QRS  Duration: 87 QT Interval:  433 QTC Calculation: 433 R Axis:   29 Text Interpretation: Sinus rhythm Confirmed by Virgina Norfolk ([id number]) on 10/13/2020 8:27:54 PM   Radiology DG Chest Portable 1 View  Result Date: 10/13/2020 CLINICAL DATA:  Left-sided chest pain EXAM: PORTABLE CHEST 1 VIEW COMPARISON:  None. FINDINGS: The heart size and mediastinal contours are within normal limits. Both lungs are clear. The visualized skeletal structures are unremarkable. IMPRESSION: No active disease. Electronically Signed   By: Jasmine Pang M.D.   On: 10/13/2020 20:50    Procedures Procedures   Medications Ordered in ED Medications - No data to display  ED Course  I have reviewed the triage vital signs and the nursing notes.  Pertinent labs & imaging results that were available during my care of the patient were reviewed by me and considered in my medical decision making (see chart for details).    MDM Rules/Calculators/A&P                          MAKILAH DOWDA is here with chest pain.  Ongoing for the past week on and off.  No cardiac risk factors.  PERC negative and doubt PE.  No longer takes birth control.  Normal vitals.  No fever.  No infectious symptoms.  Chest x-ray with noes evidence of pneumonia, pneumothorax, pleural effusion.  No significant anemia, electrolyte issue, no kidney injury.  Troponin normal.  Overall nonspecific chest pain.  Likely anxiety related or musculoskeletal component.  Given reassurance and discharged from ED in good condition.  Understands return precautions.  This chart was dictated using voice recognition software.  Despite best efforts to proofread,  errors can occur which can change the documentation meaning.    Final Clinical Impression(s) / ED Diagnoses Final diagnoses:  Nonspecific chest pain    Rx / DC Orders ED Discharge Orders    None       Virgina Norfolk, DO 10/13/20 2140

## 2020-10-13 NOTE — ED Notes (Signed)
Pt verbalizes understanding of discharge instructions. Opportunity for questioning and answers were provided. Armand removed by staff, pt discharged from ED to home. Instructed to come back for worsening chest pain.

## 2020-10-13 NOTE — ED Triage Notes (Addendum)
Pt is present to the ED for a dull, left sided chest pain x one week that has now radiated to the left side of her neck. Pt states that the chest pain has given her anxiety. Denies N/V/D or SOB.

## 2020-10-24 ENCOUNTER — Other Ambulatory Visit: Payer: Self-pay | Admitting: Gastroenterology

## 2020-10-24 DIAGNOSIS — R1013 Epigastric pain: Secondary | ICD-10-CM

## 2021-02-19 ENCOUNTER — Telehealth: Payer: Self-pay

## 2021-02-19 NOTE — Telephone Encounter (Signed)
WRONG INFROMATION, NOTES FROM ELISE LEGER MD SCANNED TO RFEFERRAL

## 2021-02-19 NOTE — Telephone Encounter (Signed)
NOTES FROM [facility]

## 2021-05-20 NOTE — Progress Notes (Signed)
Cardiology Office Note:    Date:  05/21/2021   ID:  Brooke Poole, DOB 06-30-1995, MRN [id number]  PCP:  Renaye Rakers, MD  Cardiologist:  None  Electrophysiologist:  None   Referring MD: [name], *   Chief Complaint  Patient presents with   Chest Pain    History of Present Illness:    Brooke Poole is a 26 y.o. female with a hx of Kawasaki disease who is referred by Dr. Enrigue Catena for evaluation of chest pain.  She was diagnosed with Kawasaki disease as a child and treated with IVIG.  Her mother brought pediatrician notes from [place] where she was being treated.  Echocardiogram showed ectatic branch off LAD.  She was followed for several years with regular echocardiograms.  Reports no issues until 2020 began having chest pain.  Reports it occurs multiple times per week on left side of chest and last for about 1 hour.  No clear cause.  Reports pain can be sharp or dull.  She does not exercise.  She denies any shortness of breath.  Does report she has been having lightheadedness and had recent syncopal episode that occurred in gastroenterology office where she stood up, felt lightheaded, sat back down and passed out.  Reports has been having palpitations about twice per week the last for couple hours.  She denies any lower extremity edema.  She used to vape but quit in 2021.  No known family history of heart disease.   Past Medical History:  Diagnosis Date   ADHD    Anemia    Anxiety    COVID-19 summer 2020   loss of taste and smell runny, all symptoms resolved except sense of smell   Endometriosis    suspected   Kawasaki disease (HCC) from age 78 to age 80   last work up 5 yrs ago in [place] all kawasaki disease resolved   Stomach pain    when eating    Past Surgical History:  Procedure Laterality Date   EYE SURGERY Left as child   LAPAROSCOPY N/A 10/21/2019   Procedure: DIAGNOSTIC LAPAROSCOPY;  Surgeon: [name], MD;  Location: [facility];  Service: Gynecology;  Laterality: N/A;   MOUTH SURGERY     pull tooth    MOUTH SURGERY     bracket placed    WISDOM TOOTH EXTRACTION      Current Medications: Current Meds  Medication Sig   Butalbital-APAP-Caffeine 50-300-40 MG CAPS Take 1 capsule by mouth every 4 (four) hours as needed.   Hyoscyamine Sulfate SL 0.125 MG SUBL hyoscyamine 0.125 mg sublingual tablet  DISSOLVE 1 TABLET UNDER THE TONGUE 2 TO 3 TIMES PER DAY AS NEEDED FOR SYMPTOMS   ibuprofen (ADVIL) 800 MG tablet Take 1 tablet (800 mg total) by mouth every 8 (eight) hours as needed.   lisdexamfetamine (VYVANSE) 40 MG capsule Take 40 mg by mouth daily as needed.   norethindrone (AYGESTIN) 5 MG tablet norethindrone acetate 5 mg tablet   propranolol (INDERAL) 10 MG tablet propranolol 10 mg tablet  TAKE 1 TO 2 TABLETS BY MOUTH DAILY AS NEEDED FOR PANIC ATTACKS   [DISCONTINUED] norelgestromin-ethinyl estradiol [employer]) 150-35 MCG/24HR transdermal patch Place 1 patch onto the skin once a week. Changes on sunday     Allergies:   Patient has no known allergies.   Social History   Socioeconomic History   Marital status: Single    Spouse name: Not on file   Number of  children: Not on file   Years of education: Not on file   Highest education level: Not on file  Occupational History   Not on file  Tobacco Use   Smoking status: Never   Smokeless tobacco: Never  Vaping Use   Vaping Use: Every day   Substances: Nicotine, Flavoring  Substance and Sexual Activity   Alcohol use: Yes    Comment: Socially   Drug use: Not Currently   Sexual activity: Yes  Other Topics Concern   Not on file  Social History Narrative   Not on file   Social Determinants of Health   Financial Resource Strain: Not on file  Food Insecurity: Not on file  Transportation Needs: Not on file  Physical Activity: Not on file  Stress: Not on file  Social Connections: Not on file     Family History: No known family history of heart  disease.  ROS:   Please see the history of present illness.     All other systems reviewed and are negative.  EKGs/Labs/Other Studies Reviewed:    The following studies were reviewed today:   EKG:   05/21/21: Sinus bradycardia, rate 48, no ST abnormalities  Recent Labs: 10/13/2020: BUN 11; Creatinine, Ser 0.61; Hemoglobin 10.9; Platelets 295; Potassium 3.1; Sodium 137  Recent Lipid Panel No results found for: CHOL, TRIG, HDL, CHOLHDL, VLDL, LDLCALC, LDLDIRECT  Physical Exam:    VS:  BP 114/60    Pulse (!) 48    Ht 5\' 6"  (1.676 m)    Wt 127 lb 3.2 oz (57.7 kg)    SpO2 99%    BMI 20.53 kg/m     Wt Readings from Last 3 Encounters:  05/21/21 127 lb 3.2 oz (57.7 kg)  10/13/20 127 lb (57.6 kg)  10/21/19 129 lb 11.2 oz (58.8 kg)     GEN:  Well nourished, well developed in no acute distress HEENT: Normal NECK: No JVD; No carotid bruits LYMPHATICS: No lymphadenopathy CARDIAC: RRR, no murmurs, rubs, gallops RESPIRATORY:  Clear to auscultation without rales, wheezing or rhonchi  ABDOMEN: Soft, non-tender, non-distended MUSCULOSKELETAL:  No edema; No deformity  SKIN: Warm and dry NEUROLOGIC:  Alert and oriented x 3 PSYCHIATRIC:  Normal affect   ASSESSMENT:    1. Kawasaki disease (HCC)   2. Precordial pain   3. Palpitations   4. Syncope and collapse    PLAN:    Chest pain: Atypical in description.  She does have a history of Kawasaki disease with ectatic coronary arteries seen on echocardiogram as a child.  Recommend coronary CTA for further evaluation.  No beta-blocker prior to CTA given low resting heart rate.  Will check echocardiogram to evaluate for structural heart disease.  Palpitations: Description concerning for arrhythmia, will evaluate with Zio patch x7 days  Syncope: Description suggest orthostasis, has occurred after standing.  Check echocardiogram to rule out structural heart disease as above.  Check Zio patch as above to evaluate for arrhythmia   RTC in 6  months   Medication Adjustments/Labs and Tests Ordered: Current medicines are reviewed at length with the patient today.  Concerns regarding medicines are outlined above.  Orders Placed This Encounter  Procedures   CT CORONARY MORPH W/CTA COR W/SCORE W/CA W/CM &/OR WO/CM   Basic Metabolic Panel (BMET)   LONG TERM MONITOR (3-14 DAYS)   EKG 12-Lead   ECHOCARDIOGRAM COMPLETE   No orders of the defined types were placed in this encounter.   Patient Instructions   Lab Work:  Your physician recommends that you HAVE LAB WORK TODAY  If you have labs (blood work) drawn today and your tests are completely normal, you will receive your results only by: MyChart Message (if you have MyChart) OR A paper copy in the mail If you have any lab test that is abnormal or we need to change your treatment, we will call you to review the results.   Testing/Procedures:  Your physician has requested that you have an echocardiogram. Echocardiography is a painless test that uses sound waves to create images of your heart. It provides your doctor with information about the size and shape of your heart and how well your hearts chambers and valves are working. This procedure takes approximately one hour. There are no restrictions for this procedure. [address]    Your cardiac CT will be scheduled at   [facility] [address] [place], [place] [address] [phone]   If scheduled at [facility], please arrive at the [facility] main entrance (entrance A) of [facility]  30 minutes prior to test start time. You can use the FREE valet parking offered at the main entrance (encouraged to control the heart rate for the test) Proceed to the [facility] Radiology Department (first floor) to check-in and test prep.    Please follow these instructions carefully (unless otherwise directed):    On the Night Before the Test: Be sure to Drink plenty of water. Do  not consume any caffeinated/decaffeinated beverages or chocolate 12 hours prior to your test. Do not take any antihistamines 12 hours prior to your test.   On the Day of the Test: Drink plenty of water until 1 hour prior to the test. Do not eat any food 4 hours prior to the test. You may take your regular medications prior to the test.  HOLD Furosemide/Hydrochlorothiazide morning of the test. FEMALES- please wear underwire-free bra if available, avoid dresses & tight clothing       After the Test: Drink plenty of water. After receiving IV contrast, you may experience a mild flushed feeling. This is normal. On occasion, you may experience a mild rash up to 24 hours after the test. This is not dangerous. If this occurs, you can take Benadryl 25 mg and increase your fluid intake. If you experience trouble breathing, this can be serious. If it is severe call 911 IMMEDIATELY. If it is mild, please call our office. If you take any of these medications: Glipizide/Metformin, Avandament, Glucavance, please do not take 48 hours after completing test unless otherwise instructed.  Please allow 2-4 weeks for scheduling of routine cardiac CTs. Some insurance companies require a pre-authorization which may delay scheduling of this test.   For non-scheduling related questions, please contact the cardiac imaging nurse navigator should you have any questions/concerns: Rockwell AlexandriaSara Wallace, Cardiac Imaging Nurse Navigator Larey BrickMerle Prescott, Cardiac Imaging Nurse Navigator [facility] Direct Office Dial: [phone]   For scheduling needs, including cancellations and rescheduling, please call [place], [phone].   ZIO XT- Long Term Monitor Instructions  Your physician has requested you wear a ZIO patch monitor for 7 days.  This is a single patch monitor. [employer] supplies one patch monitor per enrollment. Additional stickers are not available. Please do not apply patch if you will be  having a Nuclear Stress Test,  Echocardiogram, Cardiac CT, MRI, or Chest Xray during the period you would be wearing the  monitor. The patch cannot be worn during  these tests. You cannot remove and re-apply the  ZIO XT patch monitor.  Your ZIO patch monitor will be mailed 3 day USPS to your address on file. It may take 3-5 days  to receive your monitor after you have been enrolled.  Once you have received your monitor, please review the enclosed instructions. Your monitor  has already been registered assigning a specific monitor serial # to you.  Billing and Patient Assistance Program Information  We have supplied [employer] with any of your insurance information on file for billing purposes. [employer] offers a sliding scale Patient Assistance Program for patients that do not have  insurance, or whose insurance does not completely cover the cost of the ZIO monitor.  You must apply for the Patient Assistance Program to qualify for this discounted rate.  To apply, please call [employer] at [phone], select option 4, select option 2, ask to apply for  Patient Assistance Program. Meredeth Ide will ask your household income, and how many people  are in your household. They will quote your out-of-pocket cost based on that information.  [employer] will also be able to set up a 64-month, interest-free payment plan if needed.  Applying the monitor   Shave hair from upper left chest.  Hold abrader disc by orange tab. Rub abrader in 40 strokes over the upper left chest as  indicated in your monitor instructions.  Clean area with 4 enclosed alcohol pads. Let dry.  Apply patch as indicated in monitor instructions. Patch will be placed under collarbone on left  side of chest with arrow pointing upward.  Rub patch adhesive wings for 2 minutes. Remove white label marked "1". Remove the white  label marked "2". Rub patch adhesive wings for 2 additional minutes.  While looking in a mirror, press and release button  in center of patch. A small green light will  flash 3-4 times. This will be your only indicator that the monitor has been turned on.  Do not shower for the first 24 hours. You may shower after the first 24 hours.  Press the button if you feel a symptom. You will hear a small click. Record Date, Time and  Symptom in the Patient Logbook.  When you are ready to remove the patch, follow instructions on the last 2 pages of Patient  Logbook. Stick patch monitor onto the last page of Patient Logbook.  Place Patient Logbook in the blue and white box. Use locking tab on box and tape box closed  securely. The blue and white box has prepaid postage on it. Please place it in the mailbox as  soon as possible. Your physician should have your test results approximately 7 days after the  monitor has been mailed back to [facility].  Call [facility] Customer Care at [phone] if you have questions regarding  your ZIO XT patch monitor. Call them immediately if you see an orange light blinking on your  monitor.  If your monitor falls off in less than 4 days, contact our Monitor department at [phone].  If your monitor becomes loose or falls off after 4 days call [employer] at [phone] for  suggestions on securing your monitor   Follow-Up: At [facility], you and your health needs are our priority.  As part of our continuing mission to provide you with exceptional heart care, we have created designated Provider Care Teams.  These Care Teams include your primary Cardiologist (physician) and Advanced Practice Providers (APPs -  Physician Assistants and Nurse Practitioners) who  all work together to provide you with the care you need, when you need it.  We recommend signing up for the patient portal called "MyChart".  Sign up information is provided on this After Visit Summary.  MyChart is used to connect with patients for Virtual Visits (Telemedicine).  Patients are able to view lab/test results,  encounter notes, upcoming appointments, etc.  Non-urgent messages can be sent to your provider as well.   To learn more about what you can do with MyChart, go to [email or web address].    Your next appointment:   6 month(s)  The format for your next appointment:   In Person  Provider:   Epifanio LeschesHRISTOPHER Lilienne Weins MD      Signed, Little Ish[place]ah[place]opher L Nechuma Boven, MD  05/21/2021 11:51 PM    [facility]

## 2021-05-21 ENCOUNTER — Encounter: Payer: Self-pay | Admitting: *Deleted

## 2021-05-21 ENCOUNTER — Ambulatory Visit: Payer: BC Managed Care – PPO | Admitting: Cardiology

## 2021-05-21 ENCOUNTER — Other Ambulatory Visit: Payer: Self-pay

## 2021-05-21 ENCOUNTER — Ambulatory Visit (INDEPENDENT_AMBULATORY_CARE_PROVIDER_SITE_OTHER): Payer: BC Managed Care – PPO

## 2021-05-21 ENCOUNTER — Encounter: Payer: Self-pay | Admitting: Cardiology

## 2021-05-21 VITALS — BP 114/60 | HR 48 | Ht 66.0 in | Wt 127.2 lb

## 2021-05-21 DIAGNOSIS — R55 Syncope and collapse: Secondary | ICD-10-CM | POA: Diagnosis not present

## 2021-05-21 DIAGNOSIS — R072 Precordial pain: Secondary | ICD-10-CM

## 2021-05-21 DIAGNOSIS — R002 Palpitations: Secondary | ICD-10-CM

## 2021-05-21 DIAGNOSIS — M303 Mucocutaneous lymph node syndrome [Kawasaki]: Secondary | ICD-10-CM

## 2021-05-21 NOTE — Progress Notes (Unsigned)
Enrolled for [employer] to mail a ZIO XT long term holter monitor to the patients address on file.  

## 2021-05-21 NOTE — Patient Instructions (Signed)
Lab Work:  Your physician recommends that you HAVE LAB WORK TODAY  If you have labs (blood work) drawn today and your tests are completely normal, you will receive your results only by: MyChart Message (if you have MyChart) OR A paper copy in the mail If you have any lab test that is abnormal or we need to change your treatment, we will call you to review the results.   Testing/Procedures:  Your physician has requested that you have an echocardiogram. Echocardiography is a painless test that uses sound waves to create images of your heart. It provides your doctor with information about the size and shape of your heart and how well your hearts chambers and valves are working. This procedure takes approximately one hour. There are no restrictions for this procedure. [address]    Your cardiac CT will be scheduled at   [facility] [address] [place], [place] [address] [phone]   If scheduled at [facility], please arrive at the [facility] main entrance (entrance A) of [facility] 30 minutes prior to test start time. You can use the FREE valet parking offered at the main entrance (encouraged to control the heart rate for the test) Proceed to the [facility] Radiology Department (first floor) to check-in and test prep.    Please follow these instructions carefully (unless otherwise directed):    On the Night Before the Test: Be sure to Drink plenty of water. Do not consume any caffeinated/decaffeinated beverages or chocolate 12 hours prior to your test. Do not take any antihistamines 12 hours prior to your test.   On the Day of the Test: Drink plenty of water until 1 hour prior to the test. Do not eat any food 4 hours prior to the test. You may take your regular medications prior to the test.  HOLD Furosemide/Hydrochlorothiazide morning of the test. FEMALES- please wear underwire-free bra if available, avoid dresses &  tight clothing       After the Test: Drink plenty of water. After receiving IV contrast, you may experience a mild flushed feeling. This is normal. On occasion, you may experience a mild rash up to 24 hours after the test. This is not dangerous. If this occurs, you can take Benadryl 25 mg and increase your fluid intake. If you experience trouble breathing, this can be serious. If it is severe call 911 IMMEDIATELY. If it is mild, please call our office. If you take any of these medications: Glipizide/Metformin, Avandament, Glucavance, please do not take 48 hours after completing test unless otherwise instructed.  Please allow 2-4 weeks for scheduling of routine cardiac CTs. Some insurance companies require a pre-authorization which may delay scheduling of this test.   For non-scheduling related questions, please contact the cardiac imaging nurse navigator should you have any questions/concerns: Rockwell AlexandriaSara Wallace, Cardiac Imaging Nurse Navigator Larey BrickMerle Prescott, Cardiac Imaging Nurse Navigator [facility] Direct Office Dial: [phone]   For scheduling needs, including cancellations and rescheduling, please call [place], [phone].   ZIO XT- Long Term Monitor Instructions  Your physician has requested you wear a ZIO patch monitor for 7 days.  This is a single patch monitor. [employer] supplies one patch monitor per enrollment. Additional stickers are not available. Please do not apply patch if you will be having a Nuclear Stress Test,  Echocardiogram, Cardiac CT, MRI, or Chest Xray during the period you would be wearing the  monitor. The patch cannot be  worn during these tests. You cannot remove and re-apply the  ZIO XT patch monitor.  Your ZIO patch monitor will be mailed 3 day USPS to your address on file. It may take 3-5 days  to receive your monitor after you have been enrolled.  Once you have received your monitor, please review the enclosed instructions. Your  monitor  has already been registered assigning a specific monitor serial # to you.  Billing and Patient Assistance Program Information  We have supplied [employer] with any of your insurance information on file for billing purposes. [employer] offers a sliding scale Patient Assistance Program for patients that do not have  insurance, or whose insurance does not completely cover the cost of the ZIO monitor.  You must apply for the Patient Assistance Program to qualify for this discounted rate.  To apply, please call [employer] at [phone], select option 4, select option 2, ask to apply for  Patient Assistance Program. Meredeth Ide will ask your household income, and how many people  are in your household. They will quote your out-of-pocket cost based on that information.  [employer] will also be able to set up a 15-month, interest-free payment plan if needed.  Applying the monitor   Shave hair from upper left chest.  Hold abrader disc by orange tab. Rub abrader in 40 strokes over the upper left chest as  indicated in your monitor instructions.  Clean area with 4 enclosed alcohol pads. Let dry.  Apply patch as indicated in monitor instructions. Patch will be placed under collarbone on left  side of chest with arrow pointing upward.  Rub patch adhesive wings for 2 minutes. Remove white label marked "1". Remove the white  label marked "2". Rub patch adhesive wings for 2 additional minutes.  While looking in a mirror, press and release button in center of patch. A small green light will  flash 3-4 times. This will be your only indicator that the monitor has been turned on.  Do not shower for the first 24 hours. You may shower after the first 24 hours.  Press the button if you feel a symptom. You will hear a small click. Record Date, Time and  Symptom in the Patient Logbook.  When you are ready to remove the patch, follow instructions on the last 2 pages of Patient  Logbook. Stick patch monitor onto the  last page of Patient Logbook.  Place Patient Logbook in the blue and white box. Use locking tab on box and tape box closed  securely. The blue and white box has prepaid postage on it. Please place it in the mailbox as  soon as possible. Your physician should have your test results approximately 7 days after the  monitor has been mailed back to [place].  Call [facility] Customer Care at [phone] if you have questions regarding  your ZIO XT patch monitor. Call them immediately if you see an orange light blinking on your  monitor.  If your monitor falls off in less than 4 days, contact our Monitor department at [phone].  If your monitor becomes loose or falls off after 4 days call [employer] at [phone] for  suggestions on securing your monitor   Follow-Up: At [facility], you and your health needs are our priority.  As part of our continuing mission to provide you with exceptional heart care, we have created designated Provider Care Teams.  These Care Teams include your primary Cardiologist (physician) and Advanced Practice Providers (APPs -  Physician Assistants and  Nurse Practitioners) who all work together to provide you with the care you need, when you need it.  We recommend signing up for the patient portal called "MyChart".  Sign up information is provided on this After Visit Summary.  MyChart is used to connect with patients for Virtual Visits (Telemedicine).  Patients are able to view lab/test results, encounter notes, upcoming appointments, etc.  Non-urgent messages can be sent to your provider as well.   To learn more about what you can do with MyChart, go to [email or web address].    Your next appointment:   6 month(s)  The format for your next appointment:   In Person  Provider:   Epifanio Lesches MD

## 2021-05-22 ENCOUNTER — Telehealth (HOSPITAL_COMMUNITY): Payer: Self-pay | Admitting: Emergency Medicine

## 2021-05-22 ENCOUNTER — Ambulatory Visit (HOSPITAL_COMMUNITY): Payer: BC Managed Care – PPO | Attending: Cardiology

## 2021-05-22 DIAGNOSIS — R072 Precordial pain: Secondary | ICD-10-CM | POA: Diagnosis present

## 2021-05-22 LAB — BASIC METABOLIC PANEL
BUN/Creatinine Ratio: 13 (ref 9–23)
BUN: 10 mg/dL (ref 6–20)
CO2: 20 mmol/L (ref 20–29)
Calcium: 9.7 mg/dL (ref 8.7–10.2)
Chloride: 102 mmol/L (ref 96–106)
Creatinine, Ser: 0.77 mg/dL (ref 0.57–1.00)
Glucose: 89 mg/dL (ref 70–99)
Potassium: 4.4 mmol/L (ref 3.5–5.2)
Sodium: 138 mmol/L (ref 134–144)
eGFR: 111 mL/min/{1.73_m2} (ref >59)

## 2021-05-22 LAB — ECHOCARDIOGRAM COMPLETE
Area-P 1/2: 3.3 cm2
S' Lateral: 2.8 cm

## 2021-05-22 NOTE — Telephone Encounter (Signed)
Reaching out to patient to offer assistance regarding upcoming cardiac imaging study; pt verbalizes understanding of appt date/time, parking situation and where to check in, pre-test NPO status and medications ordered, and verified current allergies; name and call back number provided for further questions should they arise Rockwell Alexandria RN Navigator Cardiac Imaging [facility] [phone] office [phone] cell  Daily meds, except migraine med w/ caffeine  Reports some iv difficulty, reiterated importance of hydration w/ water. Arrival 4:00p

## 2021-05-23 ENCOUNTER — Other Ambulatory Visit: Payer: Self-pay

## 2021-05-23 ENCOUNTER — Ambulatory Visit (HOSPITAL_COMMUNITY)
Admission: RE | Admit: 2021-05-23 | Discharge: 2021-05-23 | Disposition: A | Discharge status: Home / Self Care | Payer: BC Managed Care – PPO | Source: Ambulatory Visit | Attending: Cardiology | Admitting: Cardiology

## 2021-05-23 DIAGNOSIS — M303 Mucocutaneous lymph node syndrome [Kawasaki]: Secondary | ICD-10-CM

## 2021-05-23 MED ORDER — IOHEXOL 350 MG/ML SOLN
100.0000 mL | Freq: Once | INTRAVENOUS | Status: AC | PRN
  Administered 2021-05-23: 100 mL via INTRAVENOUS

## 2021-05-23 MED ORDER — NITROGLYCERIN 0.4 MG SL SUBL
SUBLINGUAL_TABLET | SUBLINGUAL | Status: AC
  Filled 2021-05-23: qty 2

## 2021-05-23 MED ORDER — NITROGLYCERIN 0.4 MG SL SUBL
0.8000 mg | SUBLINGUAL_TABLET | Freq: Once | SUBLINGUAL | Status: AC
  Administered 2021-05-23: 0.8 mg via SUBLINGUAL

## 2021-05-24 ENCOUNTER — Telehealth: Payer: Self-pay | Admitting: Cardiology

## 2021-05-24 NOTE — Telephone Encounter (Signed)
Left message w/results -- OK per DPR

## 2021-05-24 NOTE — Telephone Encounter (Signed)
BMET  Little Ishikawa, MD  05/23/2021  6:42 AM EST     Normal labs   Echo  Little Ishikawa, MD  05/23/2021  6:40 AM EST     Normal echo   Cardiac CT  Little Ishikawa, MD  05/23/2021 11:06 PM EST     Normal heart arteries

## 2021-05-24 NOTE — Telephone Encounter (Signed)
Pt is calling to get results for Lab work, CT Scan and Echo

## 2021-06-02 DIAGNOSIS — R002 Palpitations: Secondary | ICD-10-CM

## 2021-10-09 ENCOUNTER — Ambulatory Visit: Payer: BC Managed Care – PPO | Admitting: Family Medicine

## 2021-10-09 ENCOUNTER — Encounter: Payer: Self-pay | Admitting: Family Medicine

## 2021-10-09 VITALS — BP 116/70 | HR 56 | Temp 99.2°F | Ht 66.0 in | Wt 140.4 lb

## 2021-10-09 DIAGNOSIS — Z7689 Persons encountering health services in other specified circumstances: Secondary | ICD-10-CM

## 2021-10-09 DIAGNOSIS — G580 Intercostal neuropathy: Secondary | ICD-10-CM

## 2021-10-09 DIAGNOSIS — K449 Diaphragmatic hernia without obstruction or gangrene: Secondary | ICD-10-CM | POA: Diagnosis not present

## 2021-10-09 DIAGNOSIS — Z8669 Personal history of other diseases of the nervous system and sense organs: Secondary | ICD-10-CM | POA: Diagnosis not present

## 2021-10-09 DIAGNOSIS — K580 Irritable bowel syndrome with diarrhea: Secondary | ICD-10-CM | POA: Diagnosis not present

## 2021-10-09 DIAGNOSIS — M303 Mucocutaneous lymph node syndrome [Kawasaki]: Secondary | ICD-10-CM

## 2021-10-09 DIAGNOSIS — N809 Endometriosis, unspecified: Secondary | ICD-10-CM

## 2021-10-09 DIAGNOSIS — M40203 Unspecified kyphosis, cervicothoracic region: Secondary | ICD-10-CM

## 2021-10-09 NOTE — Patient Instructions (Signed)
A referral was placed for you to see a new GI provider.  You should receive a call about scheduling this appointment.
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
## 2021-10-10 ENCOUNTER — Encounter: Payer: Self-pay | Admitting: Gastroenterology

## 2021-11-06 ENCOUNTER — Ambulatory Visit: Payer: BC Managed Care – PPO | Admitting: Gastroenterology

## 2021-11-06 ENCOUNTER — Encounter: Payer: Self-pay | Admitting: Gastroenterology

## 2021-11-06 ENCOUNTER — Other Ambulatory Visit (INDEPENDENT_AMBULATORY_CARE_PROVIDER_SITE_OTHER): Payer: BC Managed Care – PPO

## 2021-11-06 VITALS — BP 102/70 | HR 57 | Ht 66.0 in | Wt 143.0 lb

## 2021-11-06 DIAGNOSIS — K449 Diaphragmatic hernia without obstruction or gangrene: Secondary | ICD-10-CM

## 2021-11-06 DIAGNOSIS — D649 Anemia, unspecified: Secondary | ICD-10-CM | POA: Diagnosis not present

## 2021-11-06 DIAGNOSIS — D509 Iron deficiency anemia, unspecified: Secondary | ICD-10-CM | POA: Diagnosis not present

## 2021-11-06 DIAGNOSIS — N809 Endometriosis, unspecified: Secondary | ICD-10-CM | POA: Diagnosis not present

## 2021-11-06 DIAGNOSIS — K580 Irritable bowel syndrome with diarrhea: Secondary | ICD-10-CM

## 2021-11-06 DIAGNOSIS — R109 Unspecified abdominal pain: Secondary | ICD-10-CM

## 2021-11-06 LAB — IBC + FERRITIN
Ferritin: 22.8 ng/mL (ref 10.0–291.0)
Iron: 70 ug/dL (ref 42–145)
Saturation Ratios: 12.8 % — ABNORMAL LOW (ref 20.0–50.0)
TIBC: 548.8 ug/dL — ABNORMAL HIGH (ref 250.0–450.0)
Transferrin: 392 mg/dL — ABNORMAL HIGH (ref 212.0–360.0)

## 2021-11-06 LAB — FOLATE: Folate: >24.2 ng/mL (ref >5.9)

## 2021-11-06 LAB — C-REACTIVE PROTEIN: CRP: <1 mg/dL (ref 0.5–20.0)

## 2021-11-06 LAB — VITAMIN B12: Vitamin B-12: 773 pg/mL (ref 211–911)

## 2021-11-06 NOTE — Patient Instructions (Addendum)
Your provider has requested that you go to the basement level for lab work before leaving today. Press "B" on the elevator. The lab is located at the first door on the left as you exit the elevator.  If you are age 24 or younger, your body mass index should be between 19-25. Your Body mass index is 23.08 kg/m. If this is out of the aformentioned range listed, please consider follow up with your Primary Care Provider.   __________________________________________________________  The [facility] providers would like to encourage you to use [facility] to communicate with providers for non-urgent requests or questions.  Due to long hold times on the telephone, sending your provider a message by [facility] may be a faster and more efficient way to get a response.  Please allow 48 business hours for a response.  Please remember that this is for non-urgent requests.   Due to recent changes in healthcare laws, you may see the results of your imaging and laboratory studies on MyChart before your provider has had a chance to review them.  We understand that in some cases there may be results that are confusing or concerning to you. Not all laboratory results come back in the same time frame and the provider may be waiting for multiple results in order to interpret others.  Please give [place] 48 hours in order for your provider to thoroughly review all the results before contacting the office for clarification of your results.     Thank you for choosing me and [facility].  Vito Cirigliano, D.O.

## 2021-11-06 NOTE — Progress Notes (Signed)
Chief Complaint: IBS, abdominal pain.   Referring Provider:     Deeann Saint, MD    HPI:     Brooke Poole is a 26 y.o. female with history of anxiety, ADHD, migraines, Kawasaki disease (treated with IVIG at 45 months of age), endometriosis s/p diagnostic laparoscopy 2021 (on norethindrone 9), referred to the Gastro[facility]ology Clinic for evaluation of loose stools and GERD.  Symptoms have been present for a number of years.  Perhaps worse over the last year or so.  Describes episodic postprandial abdominal pain, diarrhea.  No hematochezia, but does have intermittent mucoid stools. Occasional NCCP. Patient carries a diagnosis of IBS, previously followed with Dr. Loreta Ave and presents today requesting second opinion.  Does note improvement in GI symptoms since starting OTC digestive enzyme with probiotics.  Has also made dietary modifications; avoids spicy, fried foods, and coffee.  Still with daily nausea.   Additionally, history of GERD and small hiatal hernia noted on EGD in 07/2020 as below. For reflux, has trialed omeprazole 40 mg/day.  Not currently taking any PPI, H2 blocker, etc.  Presents to the office with her mother who aids in much of the history.  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   No recent stool studies. Still with diarrhea. No hematochezia, but does have interimttent mucoid stools. Improved with digestive nzyme.   For her history of Endometriosis, follows with Dr. Elon Spanner at OB/GYN clinic locally and with Dr. Jacqlyn Larsen at [facility] clinic.  Bleeding and cramping improved.   History of microcytic anemia with last CBC check in 09/2020: H/H 10.9/34.7 with MCV/RDW 72/13.8.  Stable compared with labs in 07/2018.      Patient brought records today from Dr. Kenna Gilbert office.  Reviewed >25 pages of records notable for the following:  - 08/17/2020: CT abdomen/pelvis: Normal - 09/02/2019: Ferritin 19, iron 81, CRP, normal CMP, TSH.  H/H 11.7/35.9 with  MCV/RDW 72/14.5.  TTG IgG and IgA, total IgA 81 (range 87-352) - 06/29/2020: Normal CMP, WBC 3.1, H/H 11.5/36.2 with MCV/RDW 73/14.7.  Normal TSH.  Negative/normal allergen panel.  Ferritin 41, iron 139 - 07/20/2020: EGD: Small hiatal hernia.  Duodenal biopsies normal/no celiac - 07/20/2020: Colonoscopy: Internal hemorrhoids, otherwise normal.  Normal TI.  Recommended repeat at age 42 - 10/07/2020: ER evaluation for chest pain.  Normal EKG, CXR.  Hemoglobin 10.9. - 10/23/2020: Follow-up with Dr. Loreta Ave.  Prescribed methylprednisolone for costochondritis and recommended NSAID avoidance - 04/10/2021: Last appoint with Dr. Loreta Ave for continued evaluation of diarrhea and abdominal pain along with episodic nausea without emesis.  Diagnosed with IBS, prescribed hyoscyamine and recommended high-fiber diet    Past Medical History:  Diagnosis Date   ADHD    Anemia    Anxiety    COVID-19 summer 2020   loss of taste and smell runny, all symptoms resolved except sense of smell   Endometriosis    suspected   Kawasaki disease (HCC) from age 29 to age 48   last work up 5 yrs ago in [place] all kawasaki disease resolved   Stomach pain    when eating     Past Surgical History:  Procedure Laterality Date   EYE SURGERY Left as child   LAPAROSCOPY N/A 10/21/2019   Procedure: DIAGNOSTIC LAPAROSCOPY;  Surgeon: [name], MD;  Location: [facility];  Service: Gynecology;  Laterality: N/A;   MOUTH SURGERY     pull  tooth    MOUTH SURGERY     bracket placed    WISDOM TOOTH EXTRACTION     Family History  Problem Relation Age of Onset   Diabetes Maternal Grandfather    Hypertension Maternal Grandfather    Hypercalcemia Maternal Grandfather    Hypertension Paternal Grandmother    Diabetes Paternal Grandmother    Stomach cancer Neg Hx    Esophageal cancer Neg Hx    Colon cancer Neg Hx    Social History   Tobacco Use   Smoking status: Never   Smokeless tobacco: Never  Vaping Use    Vaping Use: Every day   Substances: Nicotine, Flavoring  Substance Use Topics   Alcohol use: Yes    Comment: twice a week and some weekends   Drug use: Not Currently   Current Outpatient Medications  Medication Sig Dispense Refill   Hyoscyamine Sulfate SL 0.125 MG SUBL      ibuprofen (ADVIL) 800 MG tablet Take 1 tablet (800 mg total) by mouth every 8 (eight) hours as needed. 30 tablet 0   norethindrone (AYGESTIN) 5 MG tablet norethindrone acetate 5 mg tablet     propranolol (INDERAL) 10 MG tablet propranolol 10 mg tablet  TAKE 1 TO 2 TABLETS BY MOUTH DAILY AS NEEDED FOR PANIC ATTACKS     Butalbital-APAP-Caffeine 50-300-40 MG CAPS Take 1 capsule by mouth every 4 (four) hours as needed. (Patient not taking: Reported on 11/06/2021)     lisdexamfetamine (VYVANSE) 40 MG capsule Take 40 mg by mouth daily as needed. (Patient not taking: Reported on 11/06/2021)     No current facility-administered medications for this visit.   No Known Allergies   Review of Systems: All systems reviewed and negative except where noted in HPI.     Physical Exam:    Wt Readings from Last 3 Encounters:  11/06/21 143 lb (64.9 kg)  10/09/21 140 lb 6.4 oz (63.7 kg)  05/21/21 127 lb 3.2 oz (57.7 kg)    BP 102/70   Pulse (!) 57   Ht 5\' 6"  (1.676 m)   Wt 143 lb (64.9 kg)   BMI 23.08 kg/m  Constitutional:  Pleasant, in no acute distress. Psychiatric: Normal mood and affect. Behavior is normal. Cardiovascular: Normal rate, regular rhythm. No edema Pulmonary/chest: Effort normal and breath sounds normal. No wheezing, rales or rhonchi. Abdominal: Soft, nondistended, nontender. Bowel sounds active throughout. There are no masses palpable. No hepatomegaly. Neurological: Alert and oriented to person place and time. Skin: Skin is warm and dry. No rashes noted.   ASSESSMENT AND PLAN;   1) IBS-D 2) Abdominal cramping 3) Mucoid stools  Has had a fairly extensive work-up so far for additional GI pathology to  include CT (normal), EGD (small HH otherwise normal), colonoscopy (normal), Celiac panel (mild IgA deficiency but normal TTG), food allergy testing (normal).  - Stool studies: GI PCR panel, fecal fat, fecal pancreatic elastase, fecal calprotectin - Repeat tTG IgA, IgG, and total IgA - Check IgG and IgM for immunodef - CRP - Ok to continue OTC digestive enzyme  4) Microcytic anemia - CBC, iron panel, B12, folate - If unremarkable, follow-up with PCM and check thalassemia trait if this has not been done - EGD and colonoscopy were otherwise unremarkable to include duodenal biopsies  5) Endometriosis - Follow-up in GYN clinic as planned  6) GERD 7) Small hiatal hernia - I reviewed images from the previous EGD, n/f small hiatal hernia but no erosive esophagitis.  Do not feel  that antireflux surgery with hiatal hernia would be beneficial based on symptomatology and endoscopic appearance - Continue antireflux lifestyle and dietary modifications with avoidance of exacerbating foods - Will revisit role/utility of acid suppression therapy +/- additional testing such as pH/impedance with esophageal manometry at that follow-up   RTC in 6 months or sooner prn    Shellia Cleverly, DO, FACG  11/06/2021, 9:37 AM   Deeann Saint, MD

## 2021-11-08 LAB — CBC WITH DIFFERENTIAL
Basophils Absolute: 0.1 10*3/uL (ref 0.0–0.2)
Basos: 2 %
EOS (ABSOLUTE): 0.1 10*3/uL (ref 0.0–0.4)
Eos: 1 %
Hematocrit: 37.4 % (ref 34.0–46.6)
Hemoglobin: 11.8 g/dL (ref 11.1–15.9)
Immature Grans (Abs): 0 10*3/uL (ref 0.0–0.1)
Immature Granulocytes: 0 %
Lymphocytes Absolute: 1.7 10*3/uL (ref 0.7–3.1)
Lymphs: 45 %
MCH: 23 pg — ABNORMAL LOW (ref 26.6–33.0)
MCHC: 31.6 g/dL (ref 31.5–35.7)
MCV: 73 fL — ABNORMAL LOW (ref 79–97)
Monocytes Absolute: 0.3 10*3/uL (ref 0.1–0.9)
Monocytes: 8 %
Neutrophils Absolute: 1.6 10*3/uL (ref 1.4–7.0)
Neutrophils: 44 %
RBC: 5.14 x10E6/uL (ref 3.77–5.28)
RDW: 14.3 % (ref 11.7–15.4)
WBC: 3.7 10*3/uL (ref 3.4–10.8)

## 2021-11-08 LAB — GI PROFILE, STOOL, PCR

## 2021-11-08 LAB — CELIAC PANEL 10
Antigliadin Abs, IgA: 5 units (ref 0–19)
Endomysial IgA: NEGATIVE
Gliadin IgG: 1 units (ref 0–19)
IgA/Immunoglobulin A, Serum: 76 mg/dL — ABNORMAL LOW (ref 87–352)
Tissue Transglut Ab: <2 U/mL (ref 0–5)
Transglutaminase IgA: <2 U/mL (ref 0–3)

## 2021-11-11 LAB — STOOL CULTURE: E coli, Shiga toxin Assay: NEGATIVE

## 2021-11-11 LAB — IGM: IgM, Serum: 82 mg/dL (ref 50–300)

## 2021-11-11 LAB — ANTI-SMOOTH MUSCLE ANTIBODY, IGG: Actin (Smooth Muscle) Antibody (IGG): <20 U (ref <20)

## 2021-11-13 LAB — PANCREATIC ELASTASE, FECAL: Pancreatic Elastase-1, Stool: >500 mcg/g

## 2021-11-14 LAB — CALPROTECTIN, FECAL: Calprotectin, Fecal: <5 ug/g (ref 0–120)

## 2021-11-16 LAB — FECAL FAT, QUALITATIVE
Fat Qual Neutral, Stl: NORMAL
Fat Qual Total, Stl: NORMAL

## 2021-11-27 ENCOUNTER — Telehealth: Payer: Self-pay | Admitting: Gastroenterology

## 2021-11-27 NOTE — Telephone Encounter (Signed)
Spoke with pt's mother and gave her results and recommendations. Pt's mother verbalized understanding and had no other concerns at end of call.

## 2021-11-27 NOTE — Telephone Encounter (Signed)
Inbound call from patients mother requesting a call back to discuss lab results. Please review.

## 2022-05-23 IMAGING — CT CT ABD-PELV W/ CM
2 of 4 series · 15 of 46 positions shown, 17 images · IV contrast (iopamidol)
Comparison: None.

CLINICAL DATA: Epigastric and abdominal pain. Suspected hiatal
hernia. Personal history of endometriosis.

EXAM:
CT ABDOMEN AND PELVIS WITH CONTRAST
TECHNIQUE: Multidetector CT imaging of the abdomen and pelvis was performed
using the standard protocol following bolus administration of
intravenous contrast.
CONTRAST:  100mL [id number] IOPAMIDOL ([id number]) INJECTION 61%

[Series 2: abd pelvis 5.00 br40 s3 axial · axial · 0.57mm/px · z∈[+1279,+1634]mm · 12 of 85 slices shown, 14 images]
[im 7/85  soft-tissue]
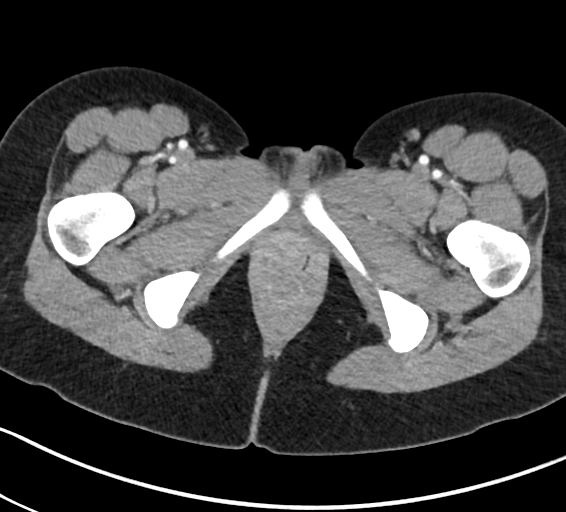
[im 7/85  bone]
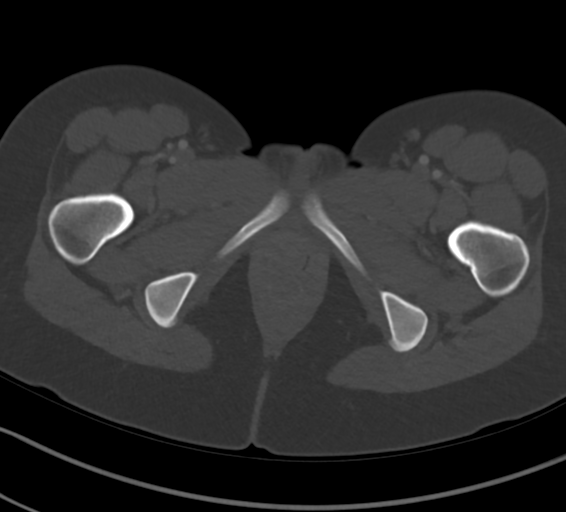
[im 13/85  soft-tissue]
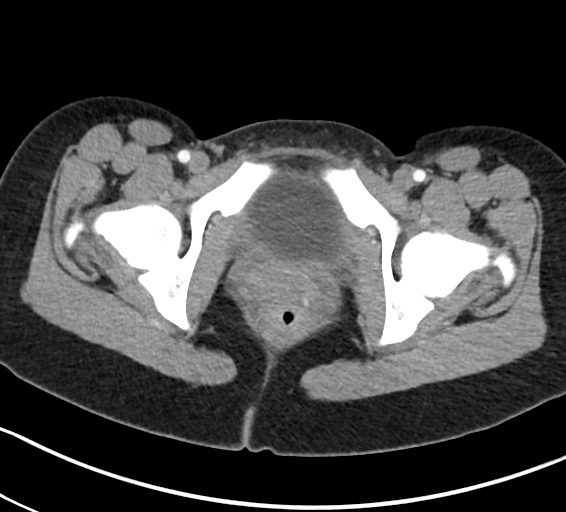
[im 20/85  soft-tissue]
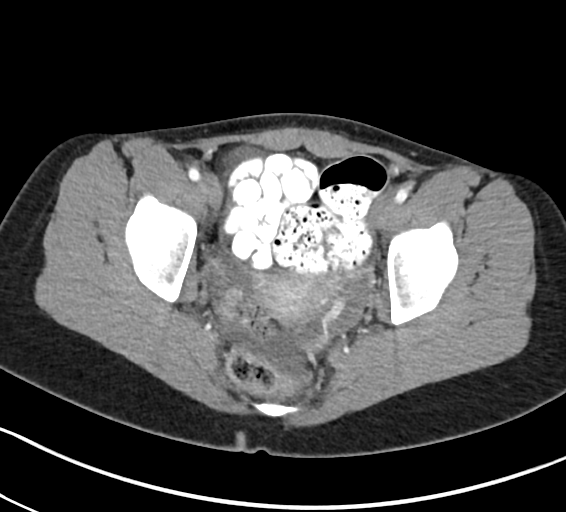
[im 26/85  soft-tissue]
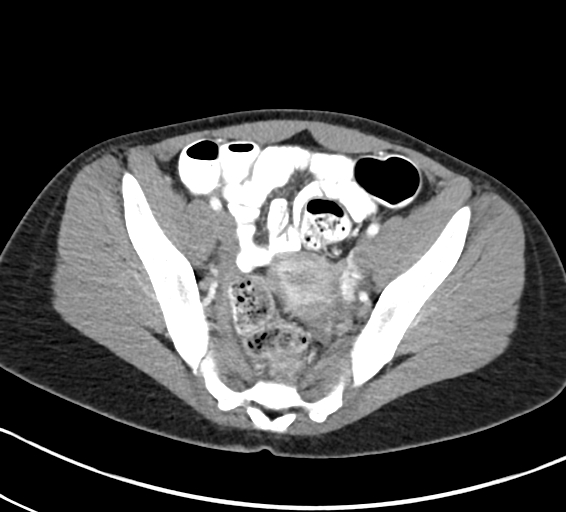
[im 33/85  soft-tissue]
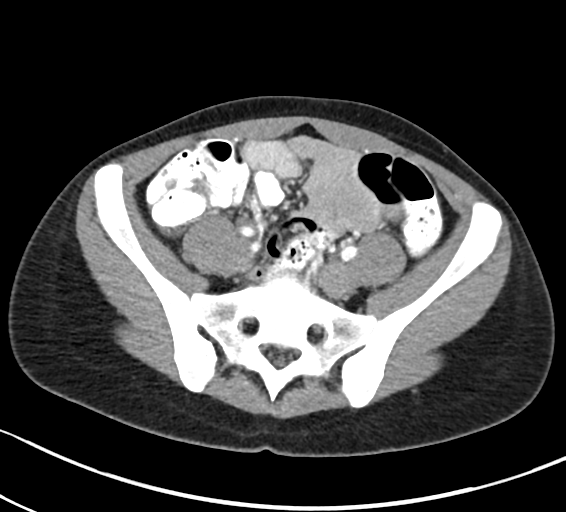
[im 39/85  soft-tissue]
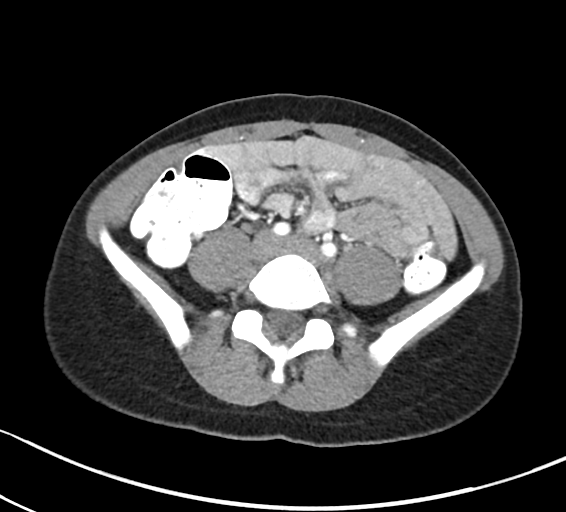
[im 46/85  soft-tissue]
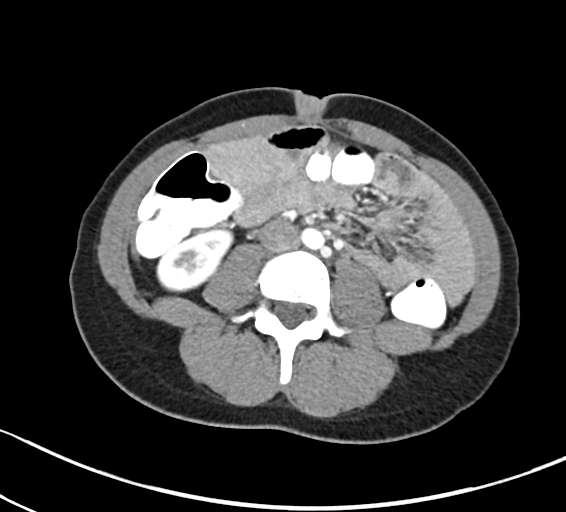
[im 52/85  soft-tissue]
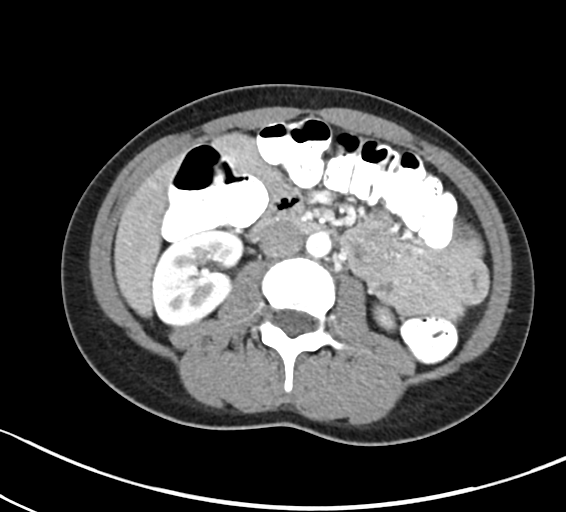
[im 59/85  soft-tissue]
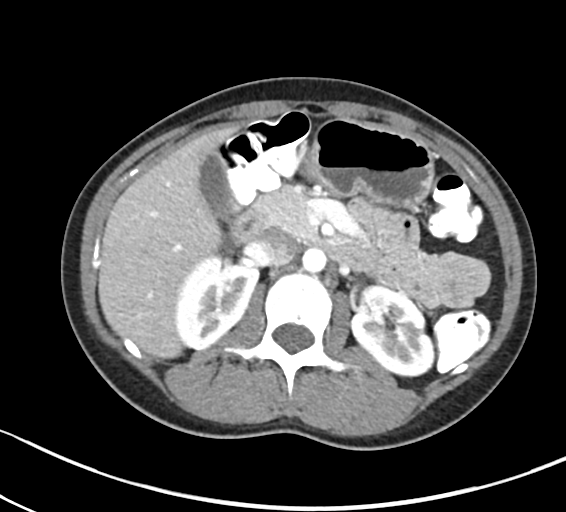
[im 59/85  bone]
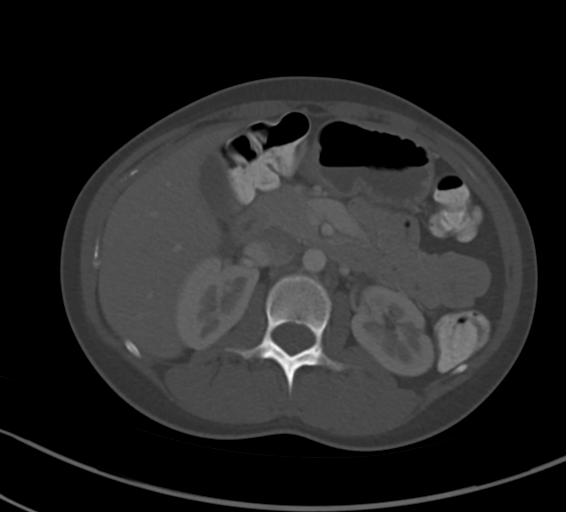
[im 65/85  soft-tissue]
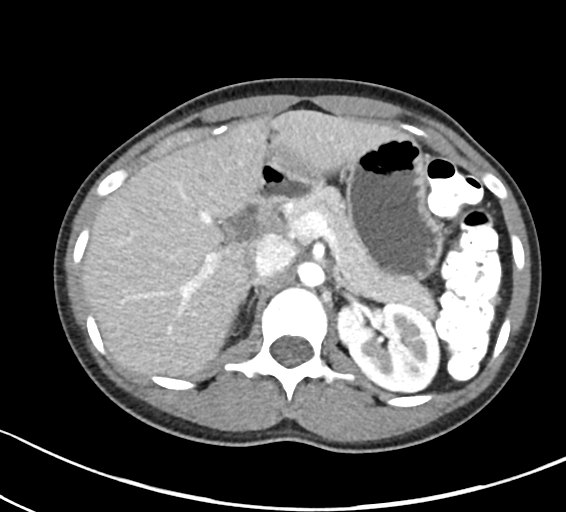
[im 72/85  soft-tissue]
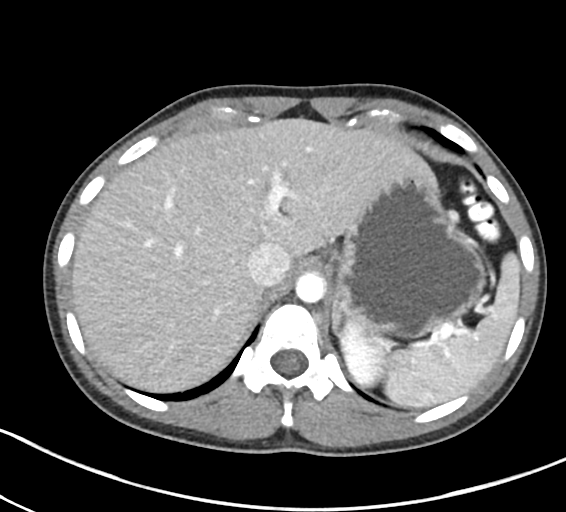
[im 78/85  soft-tissue]
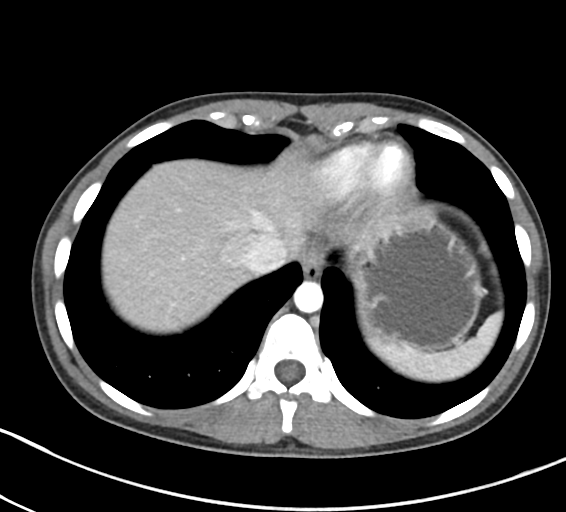

[Series 6: abd pelvis 2.00 br40 s3 cor · coronal · 0.63mm/px · 3 of 143 slices shown]
[im 48/143  soft-tissue]
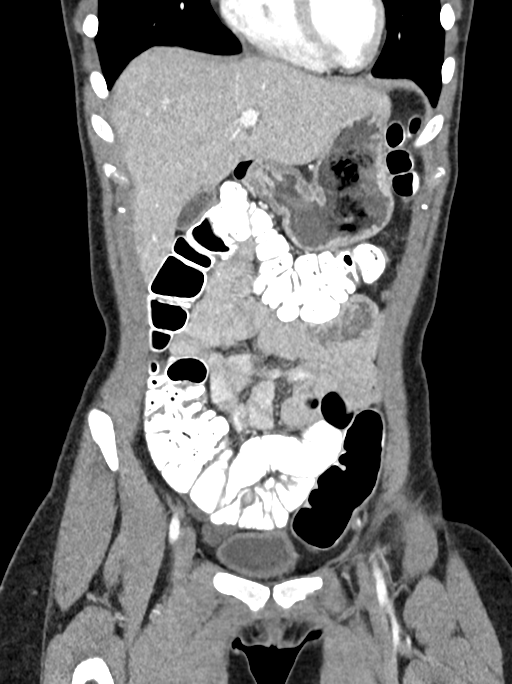
[im 64/143  soft-tissue]
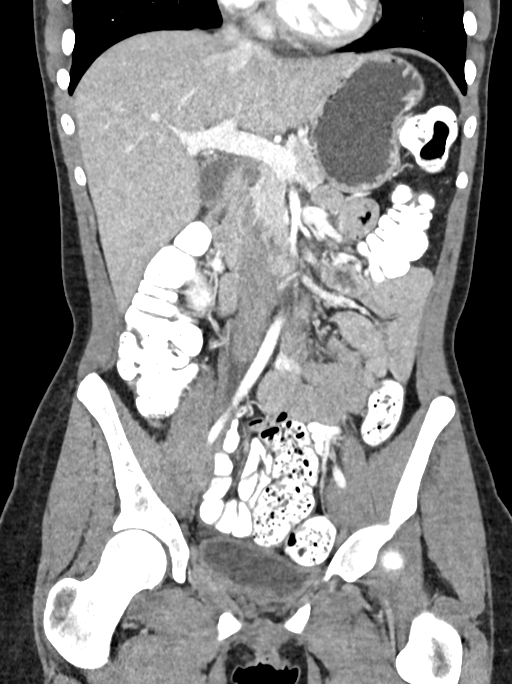
[im 79/143  soft-tissue]
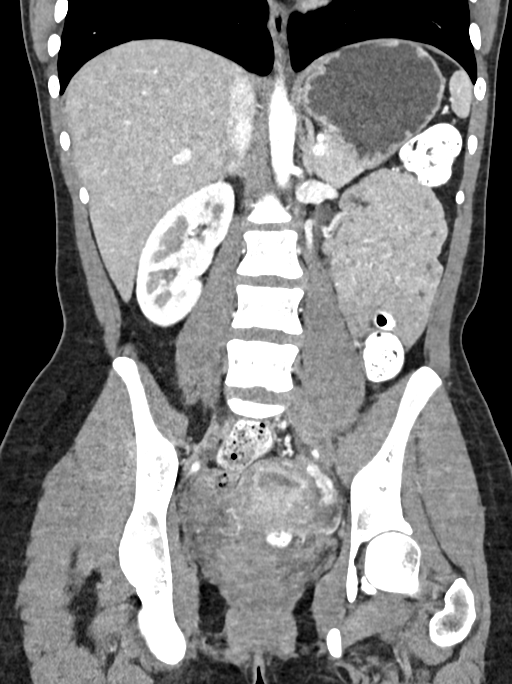

[15 of 46 positions shown; findings below may reference images not displayed]

FINDINGS: Lower Chest: No acute findings.

Hepatobiliary: No hepatic masses identified. Gallbladder is
unremarkable. No evidence of biliary ductal dilatation.

Pancreas:  No mass or inflammatory changes.

Spleen: Within normal limits in size and appearance.

Adrenals/Urinary Tract: No masses identified. No evidence of
ureteral calculi or hydronephrosis.

Stomach/Bowel: No evidence of hiatal hernia. No evidence of
obstruction, inflammatory process or abnormal fluid collections.
Normal appendix visualized.

Vascular/Lymphatic: No pathologically enlarged lymph nodes. No
abdominal aortic aneurysm.

Reproductive: Normal appearance of uterus. 2.5 cm left ovarian
corpus luteum is noted, which is physiologic in a reproductive age
female. Tiny amount of free fluid also seen.

Other:  None.

Musculoskeletal:  No suspicious bone lesions identified.
IMPRESSION: No acute findings. No evidence of hiatal hernia or other significant
abnormality.

## 2023-02-26 IMAGING — CT CT HEART MORP W/ CTA COR W/ SCORE W/ CA W/CM &/OR W/O CM
3 of 9 series · 6 of 20 positions shown, 7 images · IV contrast (APPLIED)
Comparison: Chest radiograph 10/05/2020

Addendum:
CLINICAL DATA: History of Kawasakis

EXAM:
Cardiac CTA
MEDICATIONS:
Sub lingual nitro. 4mg x 2
TECHNIQUE: The patient was scanned on a Siemens [[email or web address]]ice scanner. Gantry
rotation speed was 250 msecs. Collimation was 0.6 mm. A 100 kV
prospective scan was triggered in the ascending thoracic aorta at
35-75% of the R-R interval. Average HR during the scan was 60 bpm.
The 3D data set was interpreted on a dedicated work station using
MPR, MIP and VRT modes. A total of 80cc of contrast was used.

[Series 7: best syst 32 % · axial · 0.39mm/px · z∈[-342,-297]mm · 2 of 334 slices shown]
[im 112/334  vessel]
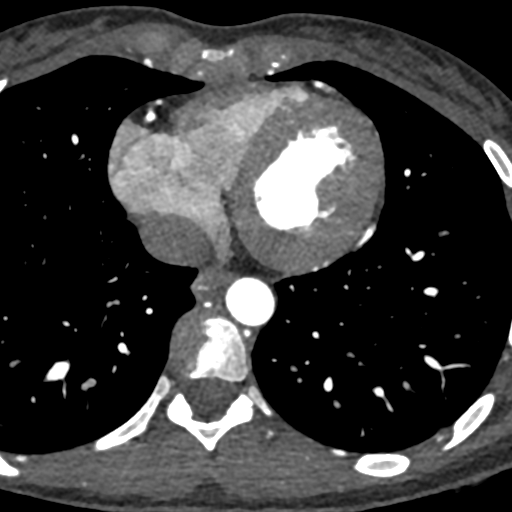
[im 223/334  vessel]
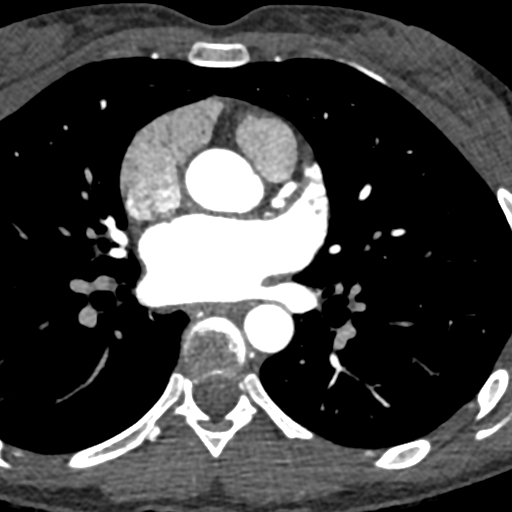

[Series 8: ts diast · axial · 0.39mm/px · z∈[-342,-297]mm · 2 of 334 slices shown, 3 images]
[im 112/334  vessel]
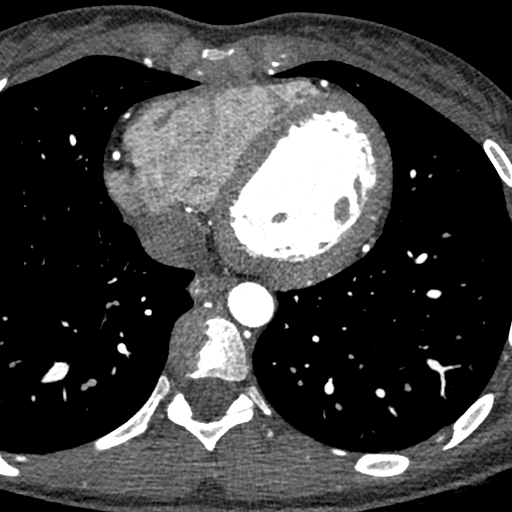
[im 112/334  lung]
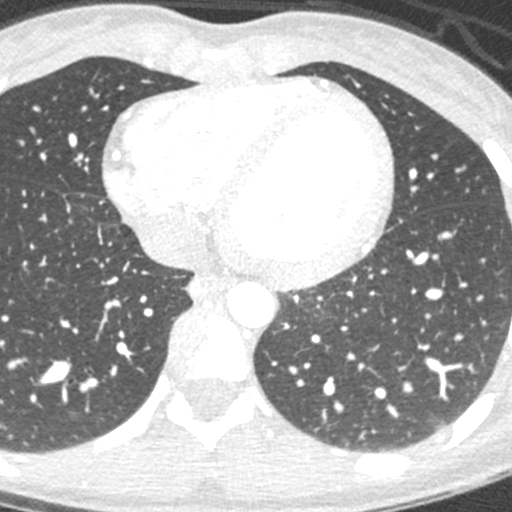
[im 223/334  vessel]
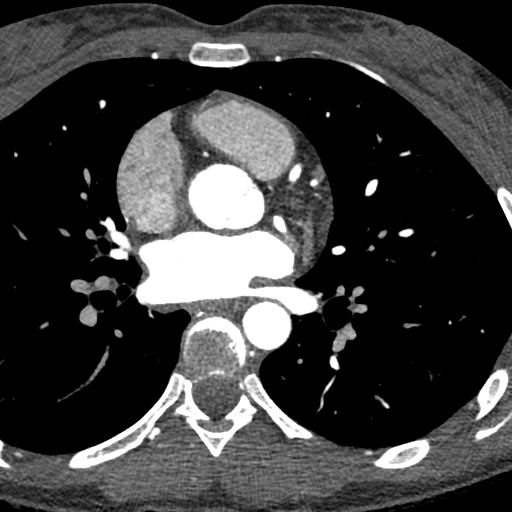

[Series 9: ts syst 32 % · axial · 0.39mm/px · z∈[-342,-297]mm · 2 of 334 slices shown]
[im 112/334  vessel]
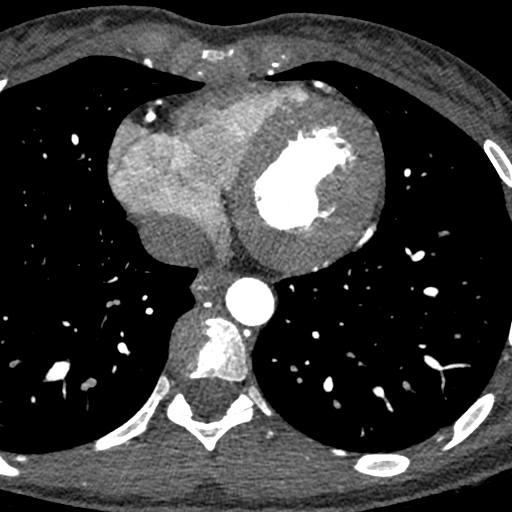
[im 223/334  vessel]
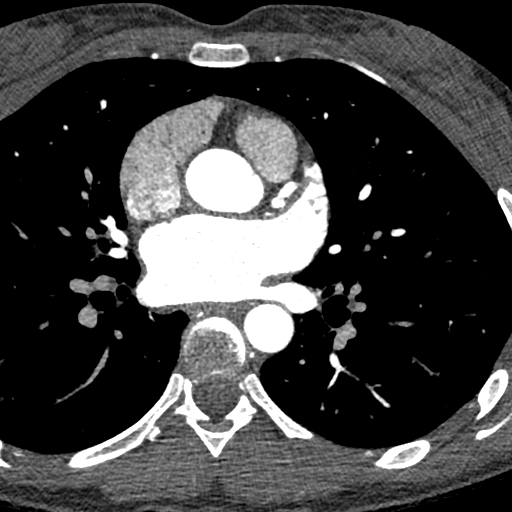

[6 of 20 positions shown; findings below may reference images not displayed]

FINDINGS: Non-cardiac: See separate report from [REDACTED].

No LA appendage thrombus. Pulmonary veins drain normally to the left
atrium.

Calcium Score: 0 Agatston units.

Coronary Arteries: Right dominant with no anomalies

LM: No plaque or stenosis.  No ectasia/aneurysm.

LAD system:  No plaque or stenosis.  No ectasia/aneurysm.

Circumflex system: No plaque or stenosis.  No ectasia/aneurysm.

RCA system: No plaque or stenosis.  No ectasia/aneurysm.
IMPRESSION: 1.  Coronary artery calcium score 0 Agatston units.

2.  Normal coronaries, no sequelae of Amirate disease.

Maris Landa

EXAM:
OVER-READ INTERPRETATION  CT CHEST

The following report is an over-read performed by radiologist Dr.
Djjonathan Antigua [[email or web address]] on 05/24/2021. This over-read
does not include interpretation of cardiac or coronary anatomy or
pathology. The coronary CTA interpretation by the cardiologist is
attached.
FINDINGS: Vascular: Normal aortic caliber. No central pulmonary embolism, on
this non-dedicated study.

Mediastinum/Nodes: No imaged thoracic adenopathy. Minimal residual
thymic tissue in the anterior mediastinum.

Lungs/Pleura: Clear imaged lungs.

Upper Abdomen: Normal imaged portions of the liver, spleen, stomach.

Musculoskeletal: No acute osseous abnormality.
IMPRESSION: No acute findings in the imaged extracardiac chest.

*** End of Addendum ***
FINDINGS: Non-cardiac: See separate report from [REDACTED].

No LA appendage thrombus. Pulmonary veins drain normally to the left
atrium.

Calcium Score: 0 Agatston units.

Coronary Arteries: Right dominant with no anomalies

LM: No plaque or stenosis.  No ectasia/aneurysm.

LAD system:  No plaque or stenosis.  No ectasia/aneurysm.

Circumflex system: No plaque or stenosis.  No ectasia/aneurysm.

RCA system: No plaque or stenosis.  No ectasia/aneurysm.
IMPRESSION: 1.  Coronary artery calcium score 0 Agatston units.

2.  Normal coronaries, no sequelae of Amirate disease.

Maris Landa

## 2023-12-11 ENCOUNTER — Telehealth: Payer: Self-pay | Admitting: Gastroenterology

## 2023-12-11 NOTE — Telephone Encounter (Signed)
 Called and spoke with patient's mother. Advised that Dr. San will not be in the office the week of 8/11-8/17. I informed patient's mother that we can get patient scheduled to see an APP that week and they will determine if patient needs another procedure, updated labs, etc. Patient has been scheduled for a follow up with Alan Coombs, PA on Wednesday, 12/30/23 at 8:40 am. Patient's mother verbalized understanding and had no concerns at the end of the call.

## 2023-12-11 NOTE — Telephone Encounter (Signed)
 Patient mother is calling requesting that we schedule her daughter for August 11-17 th with Dr. San due her having unchanged symptoms as before during her last visit 2 years ago. Patient mother was advised that Dr. San did not have anything available until September. Patient mother is requesting a call back. Please advise.

## 2023-12-29 NOTE — Progress Notes (Signed)
 12/30/2023 Brooke Poole [id number] [birth year]  Referring provider: Mercer Clotilda SAUNDERS, MD Primary GI doctor: Dr. San  ASSESSMENT AND PLAN:  IBS with diarrhea, abdominal pain 08/17/2020 CT abdomen pelvis unremarkable 07/20/2020 colonoscopy showed internal hemorrhoids, normal TI otherwise unremarkable recall age 28 11/07/2021 GI profile negative, pancreatic elastase and fecal fat unremarkable, fecal calprotectin unremarkable Continuing once a day loose stools, occ hard stools, BM once a day No blood in the stool -Get Sed rate, CRP - KUB -Consider  xifaxin trial pending results - consider pelvic floor PT, information given -Can do trial of IBGARD daily, will give Levsin  -FODMAP,  and lifestyle changes discussed - salon pas patches for possible MSK, consider trigger point injection  Epigastric pain x 6 months, GERD with history of small HH EGD 07/2020 small HH duodenal biopsies negative celiac  with radiation around to her AB, associated severe chest pain Will feel reflux of acid into throat, intermittent dysphagia with pills With lower AB pain hours later Ibuprofen  800mg  PRN once a week, rare ETOH once a week, no tobacco use, rare marijuana Spicy foods, dairy worse Partly black stool once, on iron supplements - add on pepcid  -alginate therapy given -Lifestyle changes discussed, avoid NSAIDS, ETOH, hand out given to the patient -  RUQ US  and HIDA pending results - consider EGD if not improving but no alarm symptoms - check CXR  History of endometriosis follows with Dr. Marne at OB/GYN clinic locally and with Dr. Blane at [facility] clinic.  Bleeding and cramping improved.   History of microcytic anemia 11/06/2021  HGB 11.8 MCV 73 11/06/2021 Iron 70 Ferritin 22.8 B12 773 Possible thalassemia trait, suggest getting tested  IgA deficiency Referred to allergist  Patient Care Team: Mercer Clotilda SAUNDERS, MD as PCP - General (Family Medicine)  HISTORY OF PRESENT  ILLNESS: 28 y.o. female with a past medical history of anxiety, ADHD, migraines, Kawasaki disease (treated with IVIG at 26 months of age), endometriosis s/p diagnostic laparoscopy 2021 (on norethindrone 9) and others listed below presents for evaluation of epigastric pain.   Last seen in the office 11/06/2021 by Dr. San for IBS with abdominal discomfort.  Discussed the use of AI scribe software for clinical note transcription with the patient, who gave verbal consent to proceed.  History of Present Illness   Brooke Poole is a 28 year old female with IBS and a small hiatal hernia who presents with abdominal and chest pain.  She experiences recurrent abdominal and chest pain, and is unsure if it is related to her small hiatal hernia. The pain began approximately six months ago, initially presenting in the upper abdomen and radiating to her side, often accompanied by chest discomfort severe enough to mimic a myocardial infarction. This is typically followed by lower abdominal discomfort and diarrhea, particularly after consuming spicy foods, caffeine, or possibly dairy.  Her history includes IBS, with a colonoscopy and endoscopy in 2022 revealing a small hiatal hernia but otherwise normal findings. A CT scan at that time was also normal. She has experienced symptom improvement in the past but notes a recent recurrence. Symptoms include heartburn and acid reflux, with acid sometimes reaching her throat, though this is less bothersome than the pain. She has intermittent difficulty swallowing pills and does not regularly use medications for reflux or heartburn.  She has endometriosis and takes ibuprofen  800 mg as needed for pain, though she reports reduced frequency of use due to finding a hormone that helps manage her symptoms.  She also experiences migraines and occasionally uses ibuprofen  for relief.  Her bowel movements are daily, often loose, and sometimes occur more than once a day. She  reports occasional hard stools and abdominal pain preceding loose stools. No blood in her stool currently, though she has a history of internal hemorrhoids.  Her past medical history includes Kawasaki disease as a child, which required cardiac monitoring. Recent cardiac evaluations, including an echocardiogram and Holter monitor in January 2023, were performed and the patient was told there were some extra beats but no serious findings.  She occasionally uses marijuana for anxiety and nausea, but not daily. She consumes alcohol socially, about once a week, and denies cigarette or other drug use.  She takes Ritual multivitamins containing iron to manage her anemia, which has been a long-standing issue. There is a suspicion of thalassemia, but testing has not yet been completed.     She  reports that she has never smoked. She has never used smokeless tobacco. She reports current alcohol use. She reports current drug use. Drug: Marijuana.  RELEVANT GI HISTORY, IMAGING AND LABS: Results   LABS Stool studies: Normal (2023) Hb: 12 (12/29/2023)  RADIOLOGY CT scan: Normal (2022)  DIAGNOSTIC Colonoscopy: Normal (2022) Endoscopy: Small hiatal hernia (2022) Echocardiogram: Normal (2023) Holter monitor: Predominant sinus rhythm with occasional ectopic beats (05/2021)     - 08/17/2020: CT abdomen/pelvis: Normal - 09/02/2019: Ferritin 19, iron 81, CRP, normal CMP, TSH.  H/H 11.7/35.9 with MCV/RDW 72/14.5.  TTG IgG and IgA, total IgA 81 (range 87-352) - 06/29/2020: Normal CMP, WBC 3.1, H/H 11.5/36.2 with MCV/RDW 73/14.7.  Normal TSH.  Negative/normal allergen panel.  Ferritin 41, iron 139 - 07/20/2020: EGD: Small hiatal hernia.  Duodenal biopsies normal/no celiac - 07/20/2020: Colonoscopy: Internal hemorrhoids, otherwise normal.  Normal TI.  Recommended repeat at age 62 - 10/07/2020: ER evaluation for chest pain.  Normal EKG, CXR.  Hemoglobin 10.9. - 10/23/2020: Follow-up with Dr. Kristie.  Prescribed  methylprednisolone for costochondritis and recommended NSAID avoidance - 04/10/2021: Last appoint with Dr. Kristie for continued evaluation of diarrhea and abdominal pain along with episodic nausea without emesis.  Diagnosed with IBS, prescribed hyoscyamine  and recommended high-fiber diet  CBC    Component Value Date/Time   WBC 3.7 11/06/2021 1035   WBC 5.7 10/13/2020 2037   RBC 5.14 11/06/2021 1035   RBC 4.79 10/13/2020 2037   HGB 11.8 11/06/2021 1035   HCT 37.4 11/06/2021 1035   PLT 295 10/13/2020 2037   MCV 73 (L) 11/06/2021 1035   MCH 23.0 (L) 11/06/2021 1035   MCH 22.8 (L) 10/13/2020 2037   MCHC 31.6 11/06/2021 1035   MCHC 31.4 10/13/2020 2037   RDW 14.3 11/06/2021 1035   LYMPHSABS 1.7 11/06/2021 1035   MONOABS 0.4 10/13/2020 2037   EOSABS 0.1 11/06/2021 1035   BASOSABS 0.1 11/06/2021 1035   No results for input(s): HGB in the last 8760 hours.  CMP     Component Value Date/Time   NA 138 05/22/2021 1112   K 4.4 05/22/2021 1112   CL 102 05/22/2021 1112   CO2 20 05/22/2021 1112   GLUCOSE 89 05/22/2021 1112   GLUCOSE 102 (H) 10/13/2020 2037   BUN 10 05/22/2021 1112   CREATININE 0.77 05/22/2021 1112   CALCIUM 9.7 05/22/2021 1112   PROT 7.0 08/12/2018 2238   ALBUMIN 3.6 08/12/2018 2238   AST 34 08/12/2018 2238   ALT 27 08/12/2018 2238   ALKPHOS 74 08/12/2018 2238   BILITOT 0.5 08/12/2018 2238  GFRNONAA >60 10/13/2020 2037   GFRAA >60 08/12/2018 2238      Latest Ref Rng & Units 08/12/2018   10:38 PM  Hepatic Function  Total Protein 6.5 - 8.1 g/dL 7.0   Albumin 3.5 - 5.0 g/dL 3.6   AST 15 - 41 U/L 34   ALT 0 - 44 U/L 27   Alk Phosphatase 38 - 126 U/L 74   Total Bilirubin 0.3 - 1.2 mg/dL 0.5       Current Medications:   Current Outpatient Medications (Endocrine & Metabolic):    norethindrone (AYGESTIN) 5 MG tablet, norethindrone acetate 5 mg tablet  Current Outpatient Medications (Cardiovascular):    propranolol (INDERAL) 10 MG tablet, propranolol 10 mg  tablet  TAKE 1 TO 2 TABLETS BY MOUTH DAILY AS NEEDED FOR PANIC ATTACKS   Current Outpatient Medications (Analgesics):    ibuprofen  (ADVIL ) 800 MG tablet, Take 1 tablet (800 mg total) by mouth every 8 (eight) hours as needed.   Butalbital-APAP-Caffeine 50-300-40 MG CAPS, Take 1 capsule by mouth every 4 (four) hours as needed. (Patient not taking: Reported on 12/30/2023)   Current Outpatient Medications (Other):    dextroamphetamine (DEXTROSTAT) 10 MG tablet, Take 10 mg by mouth 2 (two) times daily.   famotidine  (PEPCID ) 40 MG tablet, Take 1 tablet (40 mg total) by mouth at bedtime.   FLUoxetine (PROZAC) 40 MG capsule, Take 40 mg by mouth every morning.   hyoscyamine  (LEVSIN  SL) 0.125 MG SL tablet, Place 1 tablet (0.125 mg total) under the tongue every 6 (six) hours as needed for cramping (nausea, diarrhea).  Medical History:  Past Medical History:  Diagnosis Date   ADHD    Anemia    Anxiety    COVID-19 summer 2020   loss of taste and smell runny, all symptoms resolved except sense of smell   Endometriosis    suspected   Kawasaki disease (HCC) from age 87 to age 70   last work up 5 yrs ago in [place]  all kawasaki disease resolved   Stomach pain    when eating   Allergies: No Known Allergies   Surgical History:  She  has a past surgical history that includes Wisdom tooth extraction; Mouth surgery; Eye surgery (Left, as child); Mouth surgery; laparoscopy (N/A, 10/21/2019); Colonoscopy (07/20/2020); and Esophagogastroduodenoscopy (07/20/2020). Family History:  Her family history includes Diabetes in her maternal grandfather and paternal grandmother; Hypercalcemia in her maternal grandfather; Hypertension in her maternal grandfather and paternal grandmother; Prostate cancer in her maternal grandfather.  REVIEW OF SYSTEMS  : All other systems reviewed and negative except where noted in the History of Present Illness.  PHYSICAL EXAM: BP 106/70 (BP Location: Left Arm, Patient Position:  Sitting, Cuff Size: Normal)   Pulse (!) 55   Ht 5' 6 (1.676 m)   Wt 165 lb 2 oz (74.9 kg)   BMI 26.65 kg/m  Physical Exam   GENERAL APPEARANCE: Well nourished, in no apparent distress. HEENT: No cervical lymphadenopathy, unremarkable thyroid, sclerae anicteric, conjunctiva pink. RESPIRATORY: Respiratory effort normal, breath sounds equal bilaterally without rales, rhonchi, or wheezing. Lungs clear to auscultation bilaterally. CARDIO: Regular rate and rhythm with no murmurs, rubs, or gallops, peripheral pulses intact. ABDOMEN: Soft, non-distended, active bowel sounds in all four quadrants, RUQ tenderness to palpation, no rebound, no mass appreciated. Negative Murphy's sign RECTAL: Declines. MUSCULOSKELETAL: Full range of motion, normal gait, without edema. SKIN: Dry, intact without rashes or lesions. No jaundice. NEURO: Alert, oriented, no focal deficits. PSYCH: Cooperative, normal mood and  affect.      Alan JONELLE Coombs, PA-C 9:44 AM

## 2023-12-30 ENCOUNTER — Ambulatory Visit: Payer: Self-pay | Admitting: Physician Assistant

## 2023-12-30 ENCOUNTER — Other Ambulatory Visit

## 2023-12-30 ENCOUNTER — Encounter: Payer: Self-pay | Admitting: Physician Assistant

## 2023-12-30 ENCOUNTER — Ambulatory Visit (INDEPENDENT_AMBULATORY_CARE_PROVIDER_SITE_OTHER)
Admission: RE | Admit: 2023-12-30 | Discharge: 2023-12-30 | Disposition: A | Discharge status: Home / Self Care | Source: Ambulatory Visit | Attending: Physician Assistant | Admitting: Physician Assistant

## 2023-12-30 VITALS — BP 106/70 | HR 55 | Ht 66.0 in | Wt 165.1 lb

## 2023-12-30 DIAGNOSIS — R079 Chest pain, unspecified: Secondary | ICD-10-CM | POA: Diagnosis not present

## 2023-12-30 DIAGNOSIS — Z862 Personal history of diseases of the blood and blood-forming organs and certain disorders involving the immune mechanism: Secondary | ICD-10-CM

## 2023-12-30 DIAGNOSIS — R1013 Epigastric pain: Secondary | ICD-10-CM

## 2023-12-30 DIAGNOSIS — K219 Gastro-esophageal reflux disease without esophagitis: Secondary | ICD-10-CM

## 2023-12-30 DIAGNOSIS — K580 Irritable bowel syndrome with diarrhea: Secondary | ICD-10-CM

## 2023-12-30 DIAGNOSIS — K449 Diaphragmatic hernia without obstruction or gangrene: Secondary | ICD-10-CM

## 2023-12-30 DIAGNOSIS — R1310 Dysphagia, unspecified: Secondary | ICD-10-CM | POA: Diagnosis not present

## 2023-12-30 DIAGNOSIS — D802 Selective deficiency of immunoglobulin A [IgA]: Secondary | ICD-10-CM

## 2023-12-30 DIAGNOSIS — Z8719 Personal history of other diseases of the digestive system: Secondary | ICD-10-CM

## 2023-12-30 DIAGNOSIS — N809 Endometriosis, unspecified: Secondary | ICD-10-CM

## 2023-12-30 LAB — CBC WITH DIFFERENTIAL/PLATELET
Basophils Absolute: 0.1 K/uL (ref 0.0–0.1)
Basophils Relative: 2.2 % (ref 0.0–3.0)
Eosinophils Absolute: 0.1 K/uL (ref 0.0–0.7)
Eosinophils Relative: 1.3 % (ref 0.0–5.0)
HCT: 38.2 % (ref 36.0–46.0)
Hemoglobin: 11.9 g/dL — ABNORMAL LOW (ref 12.0–15.0)
Lymphocytes Relative: 43.9 % (ref 12.0–46.0)
Lymphs Abs: 1.8 K/uL (ref 0.7–4.0)
MCHC: 31.2 g/dL (ref 30.0–36.0)
MCV: 73.7 fl — ABNORMAL LOW (ref 78.0–100.0)
Monocytes Absolute: 0.4 K/uL (ref 0.1–1.0)
Monocytes Relative: 8.9 % (ref 3.0–12.0)
Neutro Abs: 1.8 K/uL (ref 1.4–7.7)
Neutrophils Relative %: 43.7 % (ref 43.0–77.0)
Platelets: 283 K/uL (ref 150.0–400.0)
RBC: 5.18 Mil/uL — ABNORMAL HIGH (ref 3.87–5.11)
RDW: 14.2 % (ref 11.5–15.5)
WBC: 4.1 K/uL (ref 4.0–10.5)

## 2023-12-30 LAB — SEDIMENTATION RATE: Sed Rate: 22 mm/h — ABNORMAL HIGH (ref 0–20)

## 2023-12-30 LAB — COMPREHENSIVE METABOLIC PANEL WITH GFR
ALT: 25 U/L (ref 0–35)
AST: 18 U/L (ref 0–37)
Albumin: 4.5 g/dL (ref 3.5–5.2)
Alkaline Phosphatase: 49 U/L (ref 39–117)
BUN: 7 mg/dL (ref 6–23)
CO2: 22 meq/L (ref 19–32)
Calcium: 9.3 mg/dL (ref 8.4–10.5)
Chloride: 108 meq/L (ref 96–112)
Creatinine, Ser: 0.66 mg/dL (ref 0.40–1.20)
GFR: 121.64 mL/min (ref >60.00)
Glucose, Bld: 91 mg/dL (ref 70–99)
Potassium: 4.3 meq/L (ref 3.5–5.1)
Sodium: 140 meq/L (ref 135–145)
Total Bilirubin: 0.3 mg/dL (ref 0.2–1.2)
Total Protein: 7.4 g/dL (ref 6.0–8.3)

## 2023-12-30 LAB — TSH: TSH: 0.47 u[IU]/mL (ref 0.35–5.50)

## 2023-12-30 LAB — C-REACTIVE PROTEIN: CRP: 1 mg/dL (ref 0.5–20.0)

## 2023-12-30 MED ORDER — HYOSCYAMINE SULFATE 0.125 MG SL SUBL: 0.1250 mg | SUBLINGUAL_TABLET | Freq: Four times a day (QID) | SUBLINGUAL | 1 refills | Status: DC | PRN

## 2023-12-30 MED ORDER — FAMOTIDINE 40 MG PO TABS: 40.0000 mg | ORAL_TABLET | Freq: Every day | ORAL | 2 refills | Status: AC

## 2023-12-30 NOTE — Patient Instructions (Addendum)
 _______________________________________________________  If your blood pressure at your visit was 140/90 or greater, please contact your primary care physician to follow up on this.  _______________________________________________________  If you are age 26 or older, your body mass index should be between 23-30. Your Body mass index is 26.65 kg/m. If this is out of the aforementioned range listed, please consider follow up with your Primary Care Provider.  If you are age 50 or younger, your body mass index should be between 19-25. Your Body mass index is 26.65 kg/m. If this is out of the aformentioned range listed, please consider follow up with your Primary Care Provider.   ________________________________________________________  The [facility] providers would like to encourage you to use MYCHART to communicate with providers for non-urgent requests or questions.  Due to long hold times on the telephone, sending your provider a message by [facility] may be a faster and more efficient way to get a response.  Please allow 48 business hours for a response.  Please remember that this is for non-urgent requests.  _______________________________________________________  [facility] is using a team-based approach to care.  Your team is made up of your doctor and two to three APPS. Our APPS (Nurse Practitioners and Physician Assistants) work with your physician to ensure care continuity for you. They are fully qualified to address your health concerns and develop a treatment plan. They communicate directly with your gastroenterologist to care for you. Seeing the Advanced Practice Practitioners on your physician's team can help you by facilitating care more promptly, often allowing for earlier appointments, access to diagnostic testing, procedures, and other specialty referrals.   Your provider has requested that you go to the basement level for lab work before leaving today. Press B on the  elevator. The lab is located at the first door on the left as you exit the elevator.  Your provider has requested that you have a x ray before leaving today. Please go to the basement floor to our Radiology department for the test.  You have been scheduled for an abdominal ultrasound at [facility] (1st floor of hospital) on 12-31-23 at 11am. Please arrive 15 minutes prior to your appointment for registration. Make certain not to have anything to eat or drink after midnight tonight. Should you need to reschedule your appointment, please contact radiology at [phone]. This test typically takes about 30 minutes to perform.  Due to recent changes in healthcare laws, you may see the results of your imaging and laboratory studies on MyChart before your provider has had a chance to review them.  We understand that in some cases there may be results that are confusing or concerning to you. Not all laboratory results come back in the same time frame and the provider may be waiting for multiple results in order to interpret others.  Please give us  48 hours in order for your provider to thoroughly review all the results before contacting the office for clarification of your results.   First do a trial off milk/lactose products if you use them.  Add fiber like benefiber or citracel once a day Increase activity Can do trial of IBGard which is over the counter for AB pain- Take 1-2 capsules once a day for maintence or twice a day during a flare Can send in an anti spasm medication, Levsin , to take as needed Please try to decrease stress. consider talking with PCP about anti anxiety medication or try head space app for meditation. if any worsening symptoms like  blood in stool, weight loss, please call the office   Reflux Gourmet Rescue  It is an ALGINATE THERAPY which is the only intervention that works to safeguard the esophagus by creating a protective barrier that actually stops reflux from  happening. -The general directions for use are as stated on the packaging: Take 1 teaspoon (5 ml), or more as needed or as directed by your physician, after meals and before bed. -These general directions address the most common times for reflux to occur, but our Rescue products may be taken anytime. Some individuals may take our product preemptively, when they know they will suffer from reflux, or as needed - when discomfort arises. (If taken around food, it should be consumed last.) -You do not have to take 1 teaspoon (5 ml) of the product. While one teaspoon (5ml) may be the perfect average amount to relieve reflux suffering in some, others may require more or less. You may adjust the amount of Mint Chocolate Rescue and Vanilla Caramel Rescue to the lowest amount necessary to meet your individual needs to improve your quality of life. -You may dilute the product if it is too viscous for you to consume. Keep in mind, however, that the thickness of the product was formulated to provide optimal coating and protection of your throat and esophagus. Though diluting the product is possible, it may reduce the protective function and/or length of action. -This can be used in conjunction with reflux medications and lifestyle changes.  100% ALL-NATURAL  Paraben FREE, glycerin FREE, & potassium FREE  Made entirely from all-natural ingredients considered safe for children and during pregnancy  No known side effects  All-natural flavor Gluten FREE  Allergen FREE  Vegan  Can find more information here: NameSeizer.co.nz    FODMAP stands for fermentable oligo-, di-, mono-saccharides and polyols (1). These are the scientific terms used to classify groups of carbs that are difficult for our body to digest and that are notorious for triggering digestive symptoms like bloating, gas, loose stools and stomach pain.   You can try low FODMAP diet  - start with eliminating just  one column at a time that you feel may be a trigger for you. - the table at the very bottom contains foods that are low in FODMAPs   Sometimes trying to eliminate the FODMAP's from your diet is difficult or tricky, if you are stuggling with trying to do the elimination diet you can try an enzyme.  There is a food enzymes that you sprinkle in or on your food that helps break down the FODMAP. You can read more about the enzyme by going to this site: https://fodzyme.com/  Here some information about pelvic floor dysfunction. This may be contributing to some of your symptoms. We will continue with our evaluation but I do want you to consider adding on fiber supplement with low-dose MiraLAX daily. We could also refer to pelvic floor physical therapy.   Pelvic Floor Dysfunction, Female Pelvic floor dysfunction (PFD) is a condition that results when the group of muscles and connective tissues that support the organs in the pelvis (pelvic floor muscles) do not work well. These muscles and their connections form a sling that supports the colon and bladder. In women, they also support the uterus. PFD causes pelvic floor muscles to be too weak, too tight, or both. In PFD, muscle movements are not coordinated. This may cause bowel or bladder problems. It may also cause pain. What are the causes? This condition may be  caused by an injury to the pelvic area or by a weakening of pelvic muscles. This often results from pregnancy and childbirth or other types of strain. In many cases, the exact cause is not known. What increases the risk? The following factors may make you more likely to develop this condition: Having chronic bladder tissue inflammation (interstitial cystitis). Being an older person. Being overweight. History of radiation treatment for cancer in the pelvic region. Previous pelvic surgery, such as removal of the uterus (hysterectomy). What are the signs or symptoms? Symptoms of this  condition vary and may include: Bladder symptoms, such as: Trouble starting urination and emptying the bladder. Frequent urinary tract infections. Leaking urine when coughing, laughing, or exercising (stress incontinence). Having to pass urine urgently or frequently. Pain when passing urine. Bowel symptoms, such as: Constipation. Urgent or frequent bowel movements. Incomplete bowel movements. Painful bowel movements. Leaking stool or gas. Unexplained genital or rectal pain. Genital or rectal muscle spasms. Low back pain. Other symptoms may include: A heavy, full, or aching feeling in the vagina. A bulge that protrudes into the vagina. Pain during or after sex. How is this diagnosed? This condition may be diagnosed based on: Your symptoms and medical history. A physical exam. During the exam, your health care provider may check your pelvic muscles for tightness, spasm, pain, or weakness. This may include a rectal exam and a pelvic exam. In some cases, you may have diagnostic tests, such as: Electrical muscle function tests. Urine flow testing. X-ray tests of bowel function. Ultrasound of the pelvic organs. How is this treated? Treatment for this condition depends on the symptoms. Treatment options include: Physical therapy. This may include Kegel exercises to help relax or strengthen the pelvic floor muscles. Biofeedback. This type of therapy provides feedback on how tight your pelvic floor muscles are so that you can learn to control them. Internal or external massage therapy. A treatment that involves electrical stimulation of the pelvic floor muscles to help control pain (transcutaneous electrical nerve stimulation, or TENS). Sound wave therapy (ultrasound) to reduce muscle spasms. Medicines, such as: Muscle relaxants. Bladder control medicines. Surgery to reconstruct or support pelvic floor muscles may be an option if other treatments do not help. Follow these instructions  at home: Activity Do your usual activities as told by your health care provider. Ask your health care provider if you should modify any activities. Do pelvic floor strengthening or relaxing exercises at home as told by your physical therapist. Lifestyle Maintain a healthy weight. Eat foods that are high in fiber, such as beans, whole grains, and fresh fruits and vegetables. Limit foods that are high in fat and processed sugars, such as fried or sweet foods. Manage stress with relaxation techniques such as yoga or meditation. General instructions If you have problems with leakage: Use absorbable pads or wear padded underwear. Wash frequently with mild soap. Keep your genital and anal area as clean and dry as possible. Ask your health care provider if you should try a barrier cream to prevent skin irritation. Take warm baths to relieve pelvic muscle tension or spasms. Take over-the-counter and prescription medicines only as told by your health care provider. Keep all follow-up visits. How is this prevented? The cause of PFD is not always known, but there are a few things you can do to reduce the risk of developing this condition, including: Staying at a healthy weight. Getting regular exercise. Managing stress. Contact a health care provider if: Your symptoms are not improving with  home care. You have signs or symptoms of PFD that get worse at home. You develop new signs or symptoms. You have signs of a urinary tract infection, such as: Fever. Chills. Increased urinary frequency. A burning feeling when urinating. You have not had a bowel movement in 3 days (constipation). Summary Pelvic floor dysfunction results when the muscles and connective tissues in your pelvic floor do not work well. These muscles and their connections form a sling that supports your colon and bladder. In women, they also support the uterus. PFD may be caused by an injury to the pelvic area or by a weakening  of pelvic muscles. PFD causes pelvic floor muscles to be too weak, too tight, or a combination of both. Symptoms may vary from person to person. In most cases, PFD can be treated with physical therapies and medicines. Surgery may be an option if other treatments do not help. This information is not intended to replace advice given to you by your health care provider. Make sure you discuss any questions you have with your health care provider. Document Revised: 09/12/2020 Document Reviewed: 09/12/2020 Elsevier Patient Education  2022 [employer].

## 2023-12-31 ENCOUNTER — Ambulatory Visit: Payer: Self-pay | Admitting: Physician Assistant

## 2023-12-31 ENCOUNTER — Ambulatory Visit (INDEPENDENT_AMBULATORY_CARE_PROVIDER_SITE_OTHER)

## 2023-12-31 ENCOUNTER — Ambulatory Visit (HOSPITAL_COMMUNITY)
Admission: RE | Admit: 2023-12-31 | Discharge: 2023-12-31 | Disposition: A | Discharge status: Home / Self Care | Source: Ambulatory Visit | Attending: Physician Assistant | Admitting: Physician Assistant

## 2023-12-31 DIAGNOSIS — R1013 Epigastric pain: Secondary | ICD-10-CM | POA: Diagnosis present

## 2023-12-31 DIAGNOSIS — D649 Anemia, unspecified: Secondary | ICD-10-CM

## 2023-12-31 LAB — IBC + FERRITIN
Ferritin: 46.7 ng/mL (ref 10.0–291.0)
Iron: 81 ug/dL (ref 42–145)
Saturation Ratios: 18.7 % — ABNORMAL LOW (ref 20.0–50.0)
TIBC: 434 ug/dL (ref 250.0–450.0)
Transferrin: 310 mg/dL (ref 212.0–360.0)

## 2024-01-22 ENCOUNTER — Other Ambulatory Visit: Payer: Self-pay | Admitting: Physician Assistant
